# Patient Record
Sex: Male | Born: 1975 | Race: White | Hispanic: No | Marital: Single | Smoking: Current every day smoker
Health system: Southern US, Community
[De-identification: ages and names within clinical notes are randomized; demographics above are authoritative.]

---

## 2021-03-19 ENCOUNTER — Emergency Department (HOSPITAL_COMMUNITY)

## 2021-03-19 ENCOUNTER — Other Ambulatory Visit: Payer: Self-pay

## 2021-03-19 ENCOUNTER — Inpatient Hospital Stay (HOSPITAL_COMMUNITY): Admission: EM | Admit: 2021-03-19 | Discharge: 2021-03-21 | DRG: 552 | Attending: Surgery | Admitting: Surgery

## 2021-03-19 DIAGNOSIS — Z880 Allergy status to penicillin: Secondary | ICD-10-CM

## 2021-03-19 DIAGNOSIS — N179 Acute kidney failure, unspecified: Secondary | ICD-10-CM | POA: Diagnosis present

## 2021-03-19 DIAGNOSIS — Y92148 Other place in prison as the place of occurrence of the external cause: Secondary | ICD-10-CM | POA: Diagnosis not present

## 2021-03-19 DIAGNOSIS — G8220 Paraplegia, unspecified: Secondary | ICD-10-CM | POA: Diagnosis present

## 2021-03-19 DIAGNOSIS — Z20822 Contact with and (suspected) exposure to covid-19: Secondary | ICD-10-CM | POA: Diagnosis present

## 2021-03-19 DIAGNOSIS — Y9389 Activity, other specified: Secondary | ICD-10-CM | POA: Diagnosis not present

## 2021-03-19 DIAGNOSIS — X80XXXA Intentional self-harm by jumping from a high place, initial encounter: Secondary | ICD-10-CM | POA: Diagnosis present

## 2021-03-19 DIAGNOSIS — S32019A Unspecified fracture of first lumbar vertebra, initial encounter for closed fracture: Principal | ICD-10-CM | POA: Diagnosis present

## 2021-03-19 DIAGNOSIS — R4587 Impulsiveness: Secondary | ICD-10-CM | POA: Diagnosis present

## 2021-03-19 DIAGNOSIS — F17200 Nicotine dependence, unspecified, uncomplicated: Secondary | ICD-10-CM | POA: Diagnosis present

## 2021-03-19 DIAGNOSIS — Z9189 Other specified personal risk factors, not elsewhere classified: Secondary | ICD-10-CM

## 2021-03-19 DIAGNOSIS — F449 Dissociative and conversion disorder, unspecified: Secondary | ICD-10-CM | POA: Diagnosis present

## 2021-03-19 DIAGNOSIS — S32029A Unspecified fracture of second lumbar vertebra, initial encounter for closed fracture: Secondary | ICD-10-CM | POA: Diagnosis present

## 2021-03-19 DIAGNOSIS — T1490XA Injury, unspecified, initial encounter: Secondary | ICD-10-CM

## 2021-03-19 DIAGNOSIS — T1491XA Suicide attempt, initial encounter: Secondary | ICD-10-CM | POA: Diagnosis present

## 2021-03-19 DIAGNOSIS — Z1389 Encounter for screening for other disorder: Secondary | ICD-10-CM

## 2021-03-19 LAB — URINALYSIS, ROUTINE W REFLEX MICROSCOPIC
Bilirubin Urine: NEGATIVE
Glucose, UA: NEGATIVE mg/dL
Hgb urine dipstick: NEGATIVE
Ketones, ur: 20 mg/dL — AB
Leukocytes,Ua: NEGATIVE
Nitrite: NEGATIVE
Protein, ur: NEGATIVE mg/dL
Specific Gravity, Urine: >1.046 — ABNORMAL HIGH (ref 1.005–1.030)
pH: 7 (ref 5.0–8.0)

## 2021-03-19 LAB — I-STAT CHEM 8, ED
BUN: 13 mg/dL (ref 6–20)
Calcium, Ion: 1.15 mmol/L (ref 1.15–1.40)
Chloride: 108 mmol/L (ref 98–111)
Creatinine, Ser: 1.2 mg/dL (ref 0.61–1.24)
Glucose, Bld: 97 mg/dL (ref 70–99)
HCT: 44 % (ref 39.0–52.0)
Hemoglobin: 15 g/dL (ref 13.0–17.0)
Potassium: 3.9 mmol/L (ref 3.5–5.1)
Sodium: 144 mmol/L (ref 135–145)
TCO2: 24 mmol/L (ref 22–32)

## 2021-03-19 LAB — COMPREHENSIVE METABOLIC PANEL
ALT: 23 U/L (ref 0–44)
AST: 31 U/L (ref 15–41)
Albumin: 4 g/dL (ref 3.5–5.0)
Alkaline Phosphatase: 87 U/L (ref 38–126)
Anion gap: 10 (ref 5–15)
BUN: 13 mg/dL (ref 6–20)
CO2: 24 mmol/L (ref 22–32)
Calcium: 9.2 mg/dL (ref 8.9–10.3)
Chloride: 107 mmol/L (ref 98–111)
Creatinine, Ser: 1.33 mg/dL — ABNORMAL HIGH (ref 0.61–1.24)
GFR, Estimated: >60 mL/min (ref >60)
Glucose, Bld: 101 mg/dL — ABNORMAL HIGH (ref 70–99)
Potassium: 3.9 mmol/L (ref 3.5–5.1)
Sodium: 141 mmol/L (ref 135–145)
Total Bilirubin: 0.5 mg/dL (ref 0.3–1.2)
Total Protein: 7.6 g/dL (ref 6.5–8.1)

## 2021-03-19 LAB — LACTIC ACID, PLASMA: Lactic Acid, Venous: 1.5 mmol/L (ref 0.5–1.9)

## 2021-03-19 LAB — RAPID URINE DRUG SCREEN, HOSP PERFORMED
Amphetamines: NOT DETECTED
Barbiturates: NOT DETECTED
Benzodiazepines: POSITIVE — AB
Cocaine: NOT DETECTED
Opiates: POSITIVE — AB
Tetrahydrocannabinol: NOT DETECTED

## 2021-03-19 LAB — CBC
HCT: 46.1 % (ref 39.0–52.0)
Hemoglobin: 15.1 g/dL (ref 13.0–17.0)
MCH: 26.5 pg (ref 26.0–34.0)
MCHC: 32.8 g/dL (ref 30.0–36.0)
MCV: 81 fL (ref 80.0–100.0)
Platelets: 273 10*3/uL (ref 150–400)
RBC: 5.69 MIL/uL (ref 4.22–5.81)
RDW: 16 % — ABNORMAL HIGH (ref 11.5–15.5)
WBC: 9.9 10*3/uL (ref 4.0–10.5)
nRBC: 0 % (ref 0.0–0.2)

## 2021-03-19 LAB — PROTIME-INR
INR: 1.2 (ref 0.8–1.2)
Prothrombin Time: 14.8 seconds (ref 11.4–15.2)

## 2021-03-19 LAB — RESP PANEL BY RT-PCR (FLU A&B, COVID) ARPGX2
Influenza A by PCR: NEGATIVE
Influenza B by PCR: NEGATIVE
SARS Coronavirus 2 by RT PCR: NEGATIVE

## 2021-03-19 LAB — ETHANOL: Alcohol, Ethyl (B): <10 mg/dL (ref <10)

## 2021-03-19 IMAGING — CT CT CERVICAL SPINE W/O CM
3 of 5 series · 13 of 35 positions shown, 14 images · non-contrast
Comparison: None.

CLINICAL DATA: Head and neck trauma, fall



[Series 4: c spine soft · axial · 0.37mm/px · z∈[-250,-116]mm · 4 of 101 slices shown, 5 images]
[im 17/101  soft-tissue]
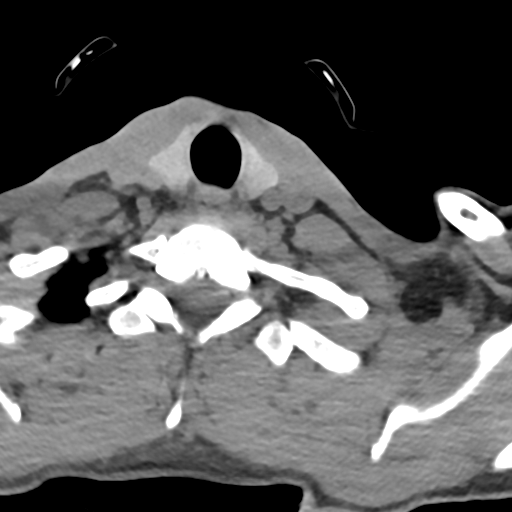
[im 17/101  bone]
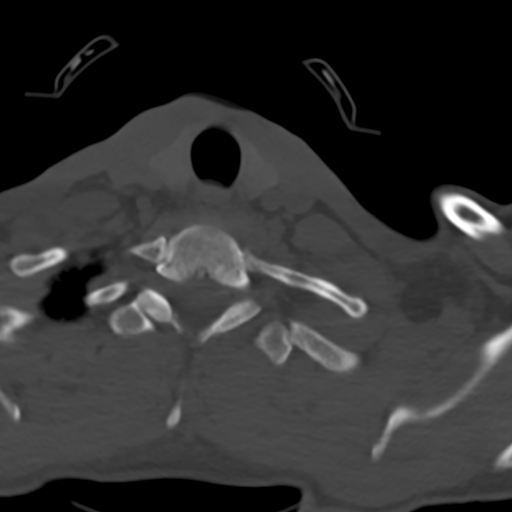
[im 34/101  bone]
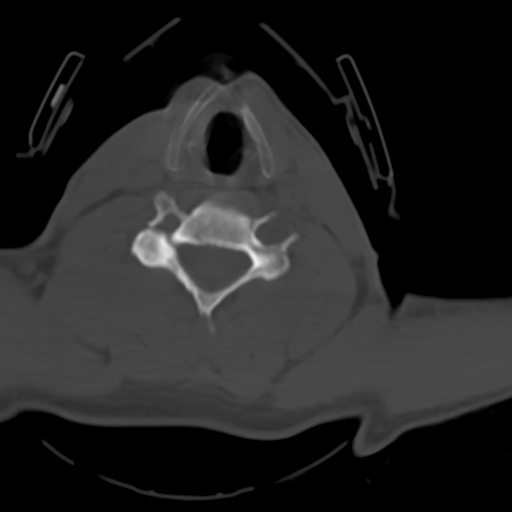
[im 67/101  bone]
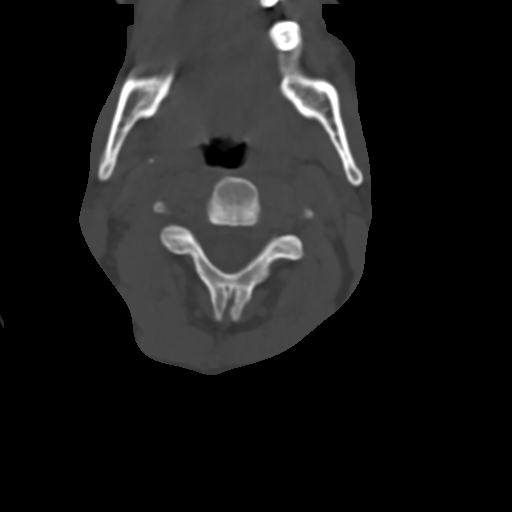
[im 84/101  bone]
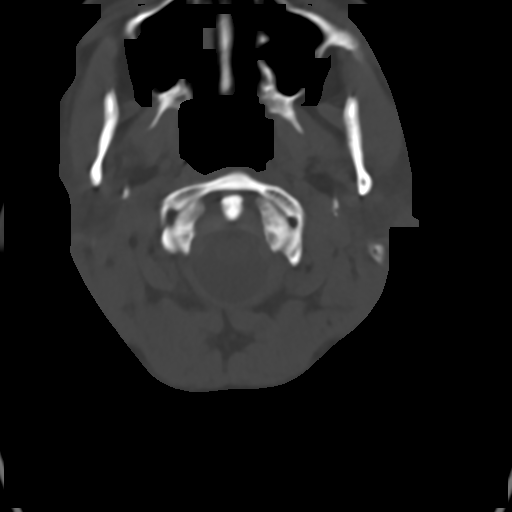

[Series 6: sag bone · sagittal · 0.29mm/px · 6 of 60 slices shown]
[im 10/60  bone]
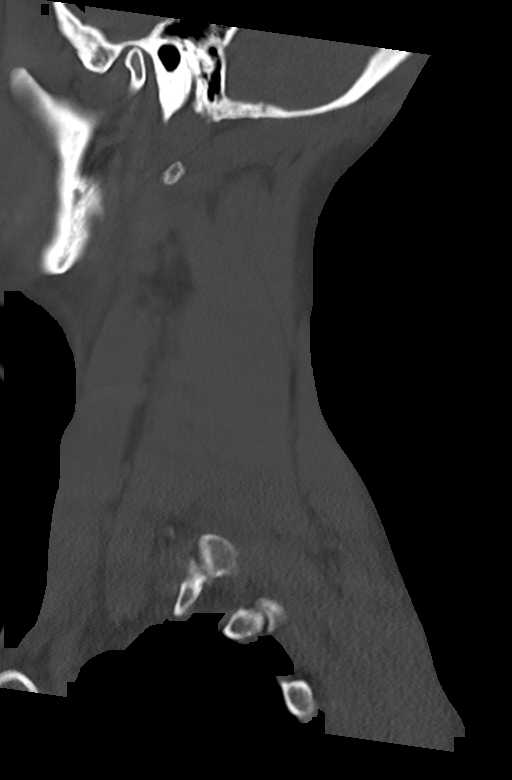
[im 20/60  bone]
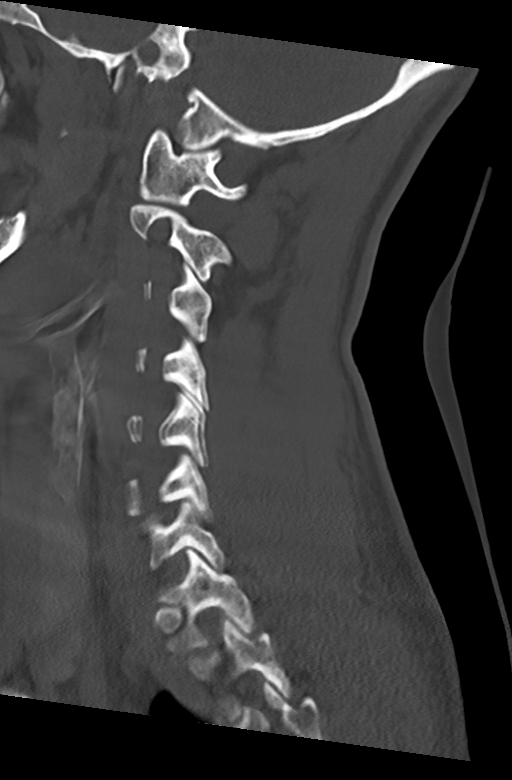
[im 28/60  soft-tissue]
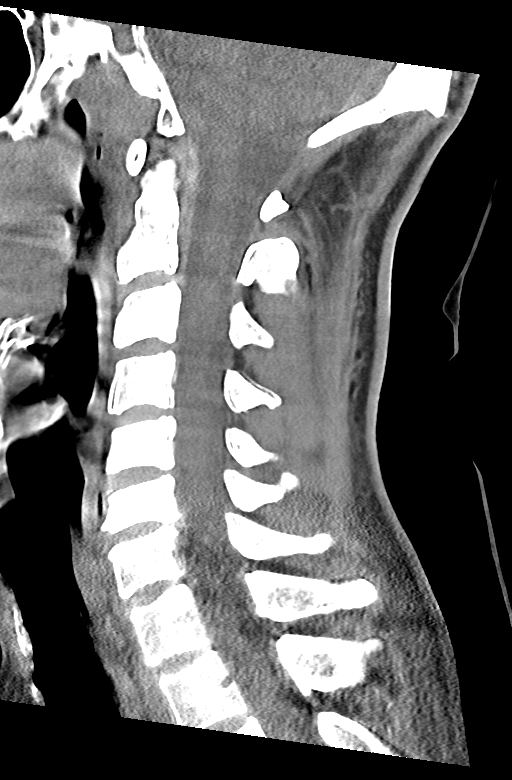
[im 30/60  bone]
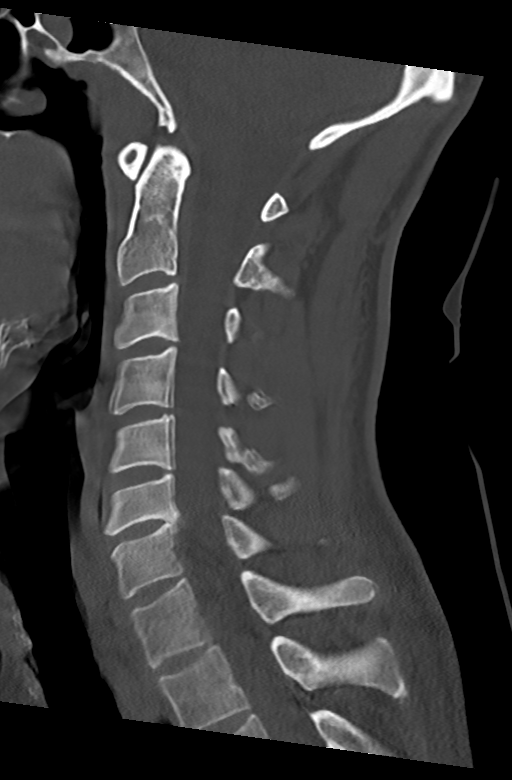
[im 40/60  bone]
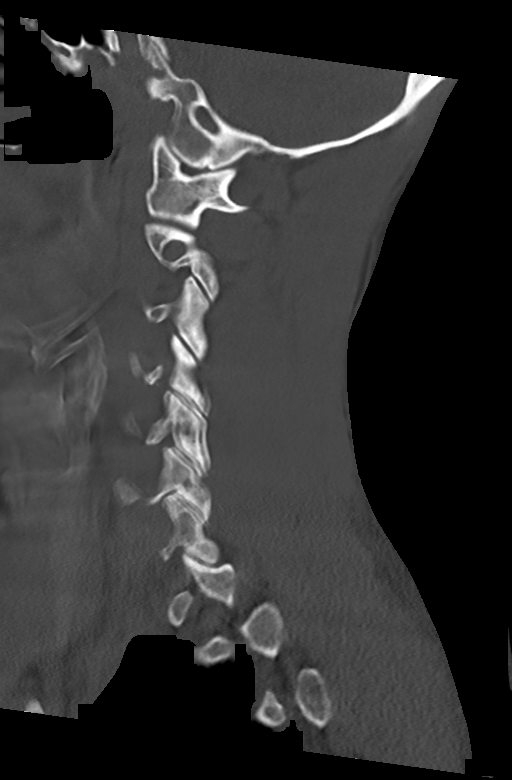
[im 50/60  bone]
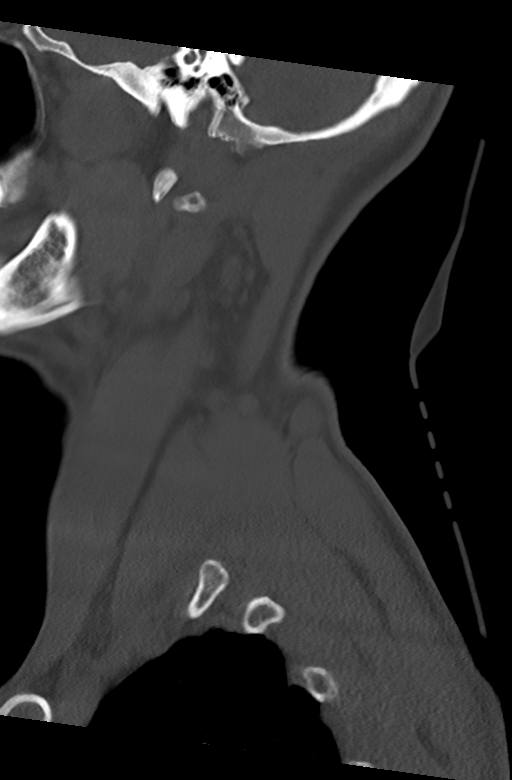

[Series 9: cor bone · coronal · 0.28mm/px · 3 of 42 slices shown]
[im 9/42  bone]
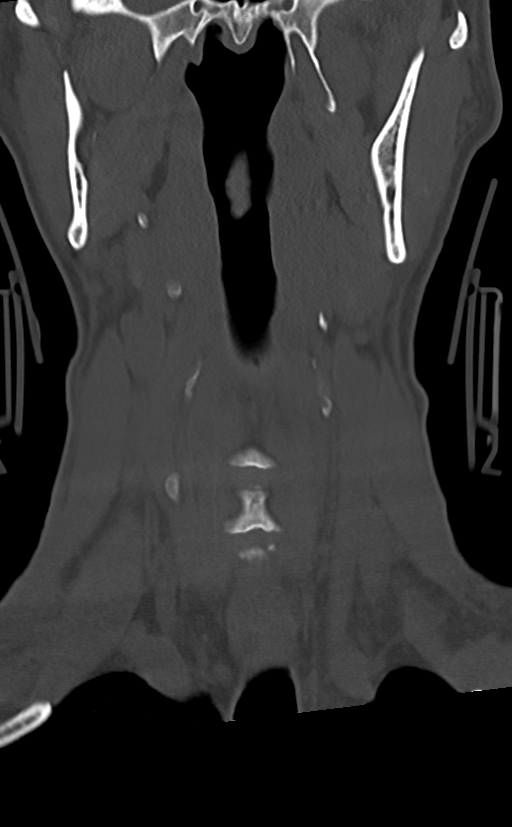
[im 17/42  bone]
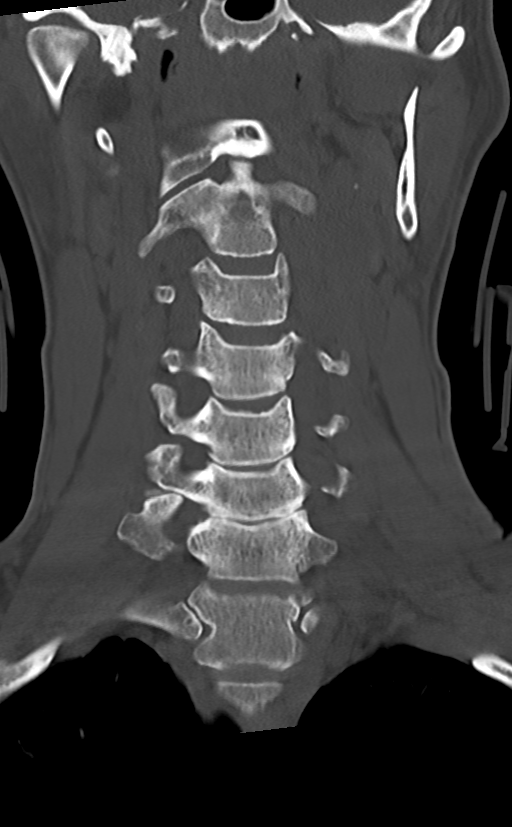
[im 25/42  bone]
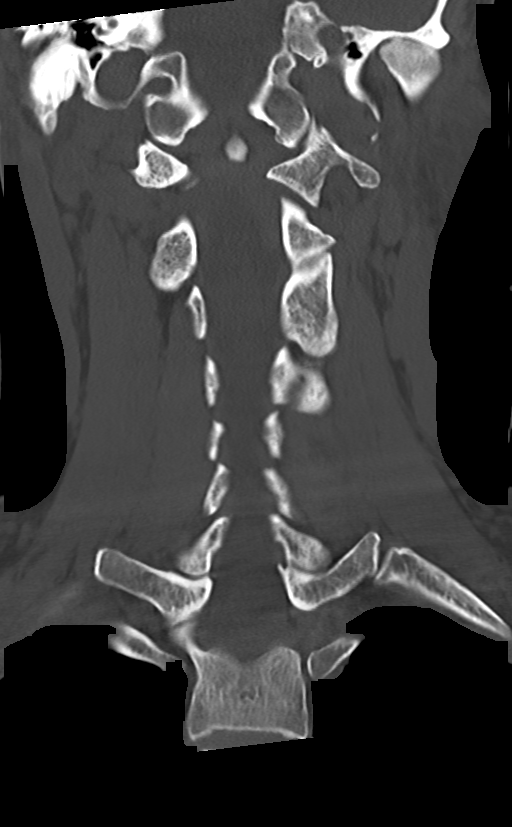

[13 of 35 positions shown; findings below may reference images not displayed]

FINDINGS: CT HEAD FINDINGS

Brain: No evidence of acute infarction, hemorrhage, hydrocephalus,
extra-axial collection or mass lesion/mass effect.

Vascular: No hyperdense vessel or unexpected calcification.

Skull: Normal. Negative for fracture or focal lesion.

Sinuses/Orbits: No acute finding.

Other: None.

CT CERVICAL SPINE FINDINGS

Examination of the cervical spine is mildly limited by motion
artifact throughout.

Alignment: Normal.

Skull base and vertebrae: No acute fracture. No primary bone lesion
or focal pathologic process.

Soft tissues and spinal canal: No prevertebral fluid or swelling. No
visible canal hematoma.

Disc levels: Focally mild disc space height loss and osteophytosis
of C6-C7. Disc spaces are otherwise preserved.

Upper chest: Negative.

Other: None.
IMPRESSION: 1. No acute intracranial pathology.
2. Examination of the cervical spine is mildly limited by motion
artifact throughout. Within this limitation, no fracture or static
subluxation of the cervical spine.

These results were called by telephone at the time of interpretation
on [DATE] at [DATE] to Dr. MIKAELA NICOLE, who verbally
acknowledged these results.

## 2021-03-19 IMAGING — DX DG PORTABLE PELVIS
1 series · 1 of 1 positions shown · non-contrast
Comparison: None.

CLINICAL DATA: Trauma.

EXAM:
PORTABLE PELVIS 1-2 VIEWS

[pelvis]
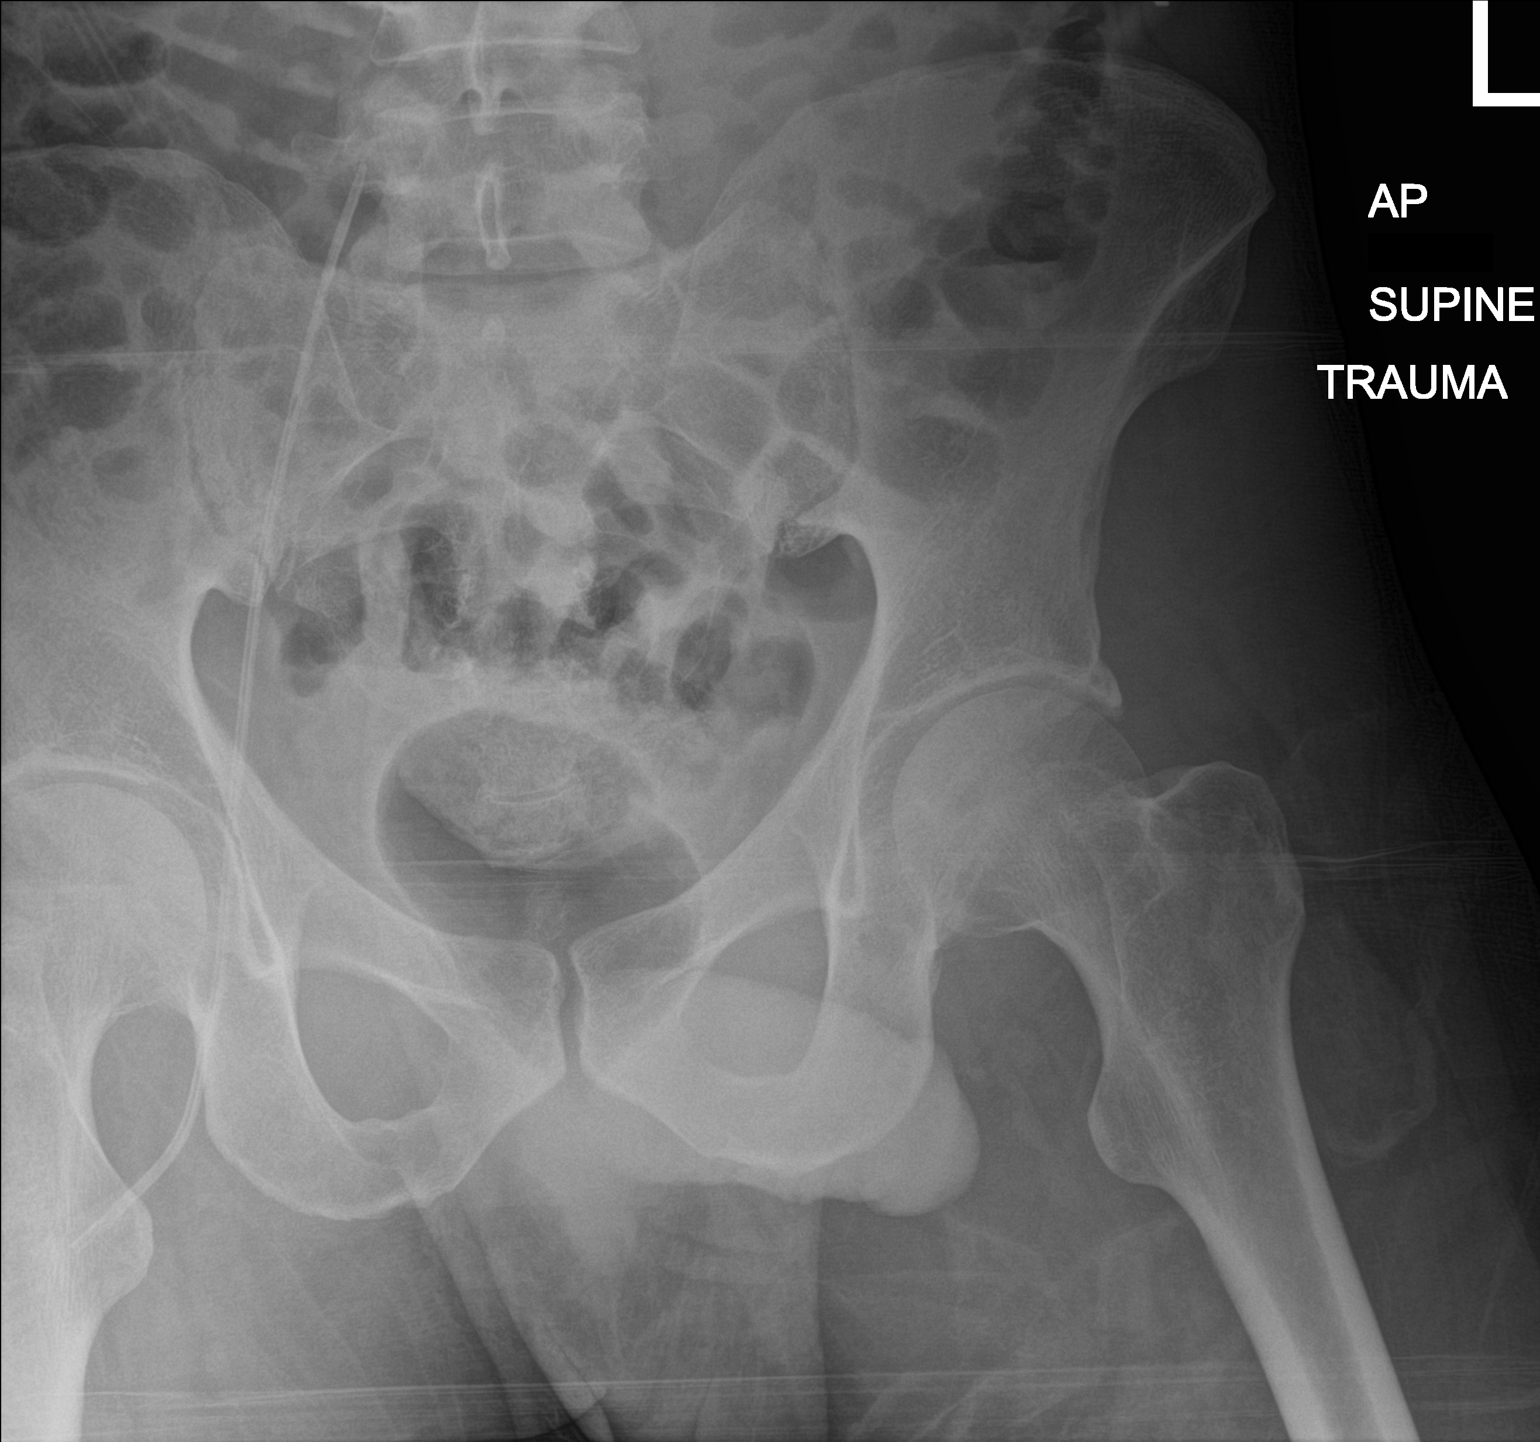

[1 of 1 positions shown; findings below may reference images not displayed]

FINDINGS: Portion of the RIGHT pelvis is off the field of view but okayed by
the trauma surgeon.

No acute bony abnormalities are noted.

A RIGHT femoral catheter is noted.

Bowel gas pattern is unremarkable.
IMPRESSION: No acute bony abnormalities.

## 2021-03-19 IMAGING — CT CT CHEST-ABD-PELV W/ CM
2 of 5 series · 12 of 46 positions shown, 14 images · IV contrast (agent unspecified)
Comparison: None.

CLINICAL DATA: Polytrauma, fall

EXAM:
CT CHEST, ABDOMEN, AND PELVIS WITH CONTRAST
CT THORACIC AND LUMBAR SPINE WITH CONTRAST
TECHNIQUE: Multidetector CT imaging of the chest, abdomen and pelvis was
performed following the standard protocol during bolus
administration of intravenous contrast.

[Series 1: cap with · axial · 0.72mm/px · z∈[-876,-276]mm · 9 of 145 slices shown, 11 images]
[im 13/145  soft-tissue]
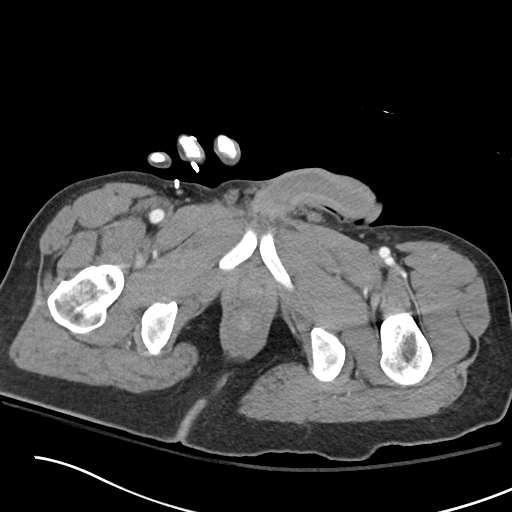
[im 13/145  bone]
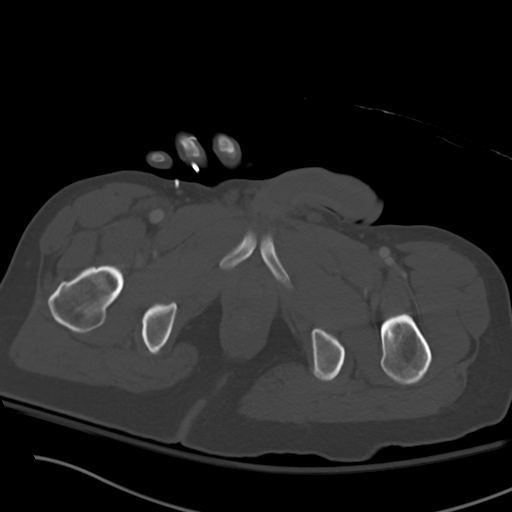
[im 25/145  soft-tissue]
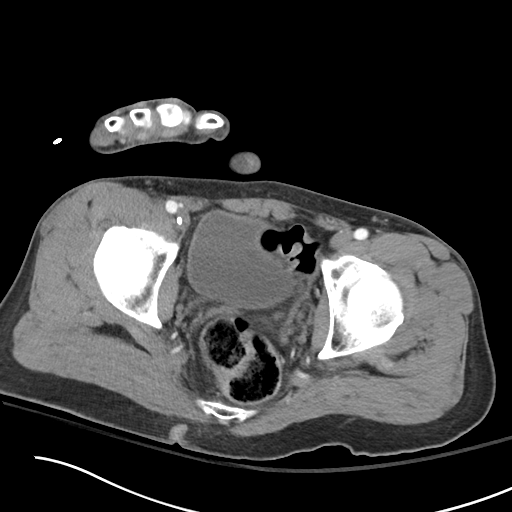
[im 49/145  soft-tissue]
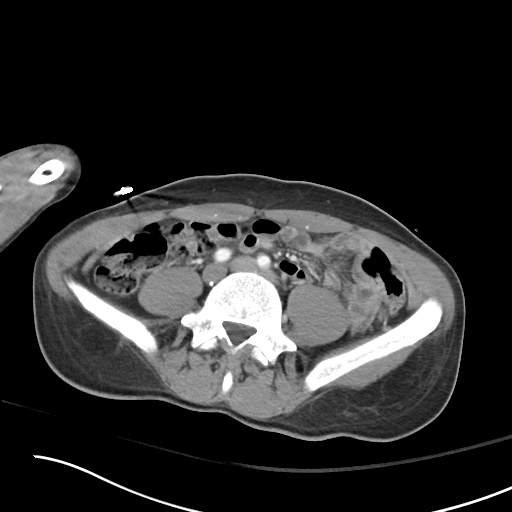
[im 61/145  soft-tissue]
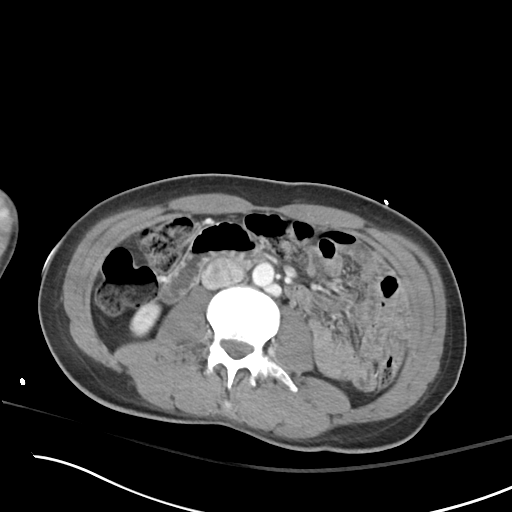
[im 73/145  soft-tissue]
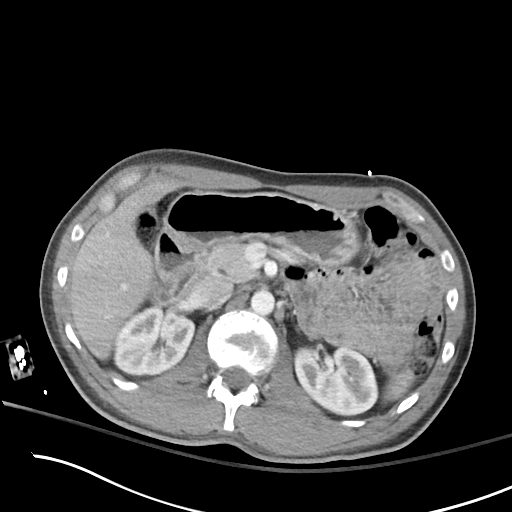
[im 85/145  soft-tissue]
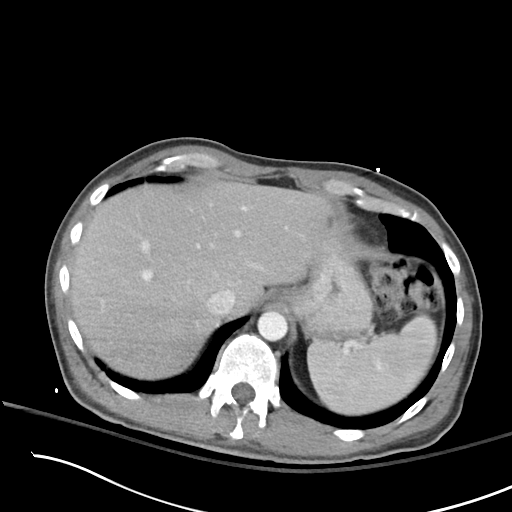
[im 97/145  soft-tissue]
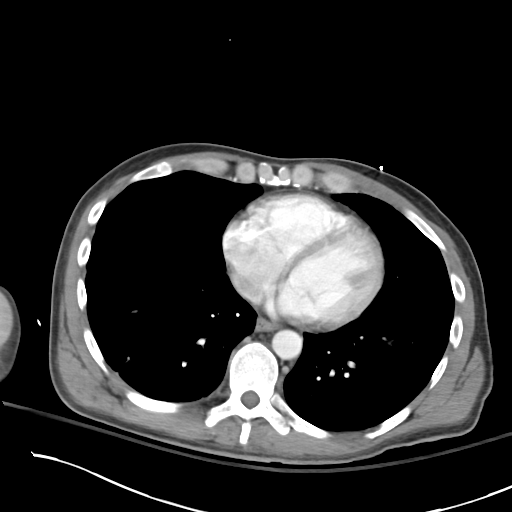
[im 121/145  soft-tissue]
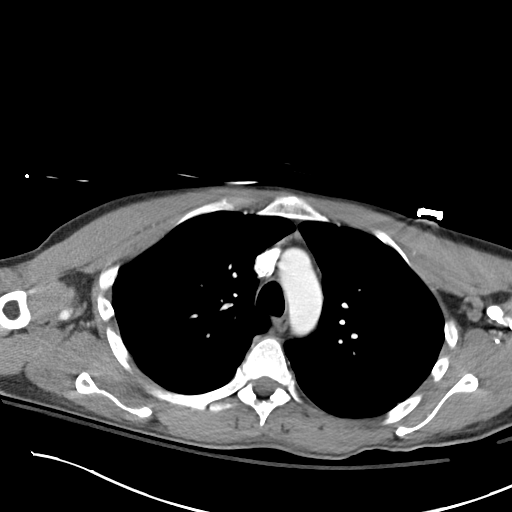
[im 133/145  soft-tissue]
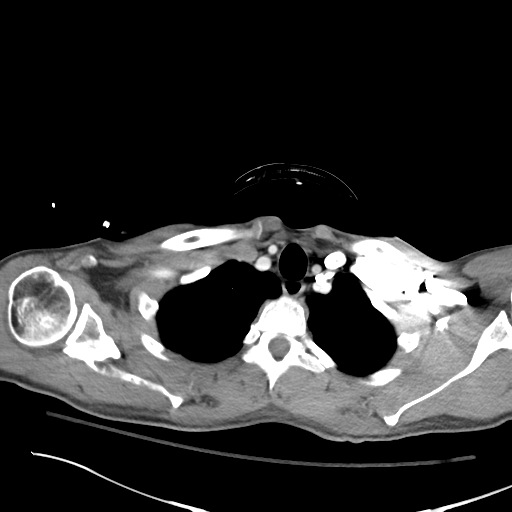
[im 133/145  bone]
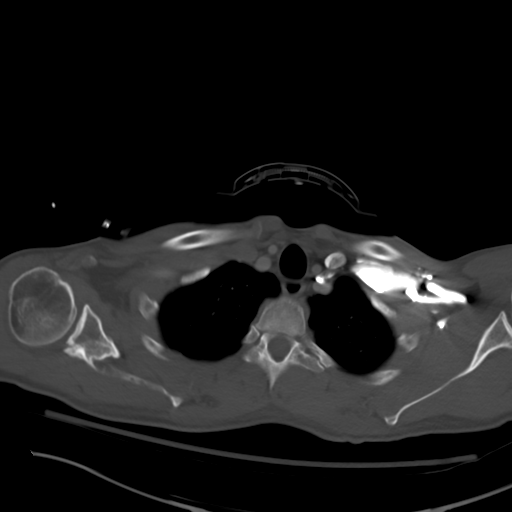

[Series 4: cor · coronal · 0.71mm/px · 3 of 78 slices shown]
[im 26/78  soft-tissue]
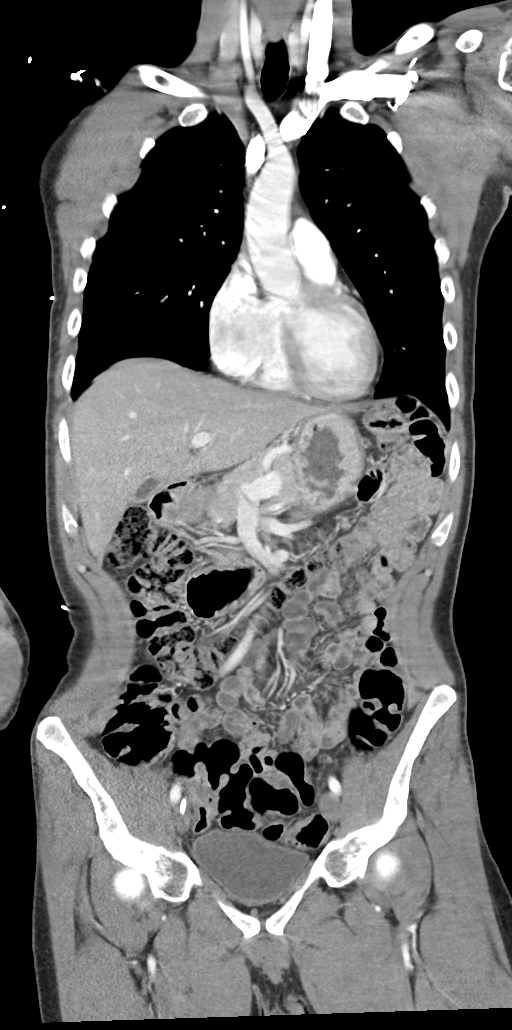
[im 35/78  soft-tissue]
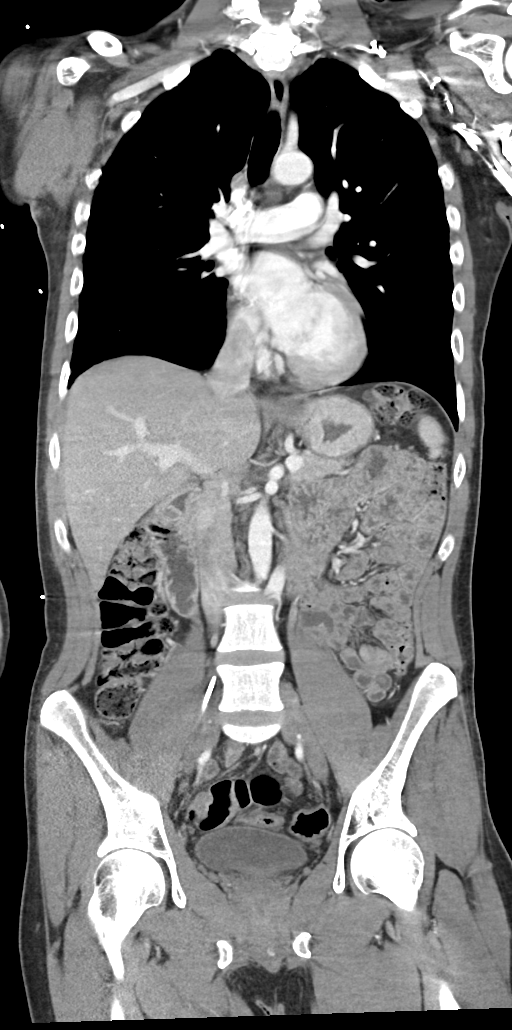
[im 43/78  soft-tissue]
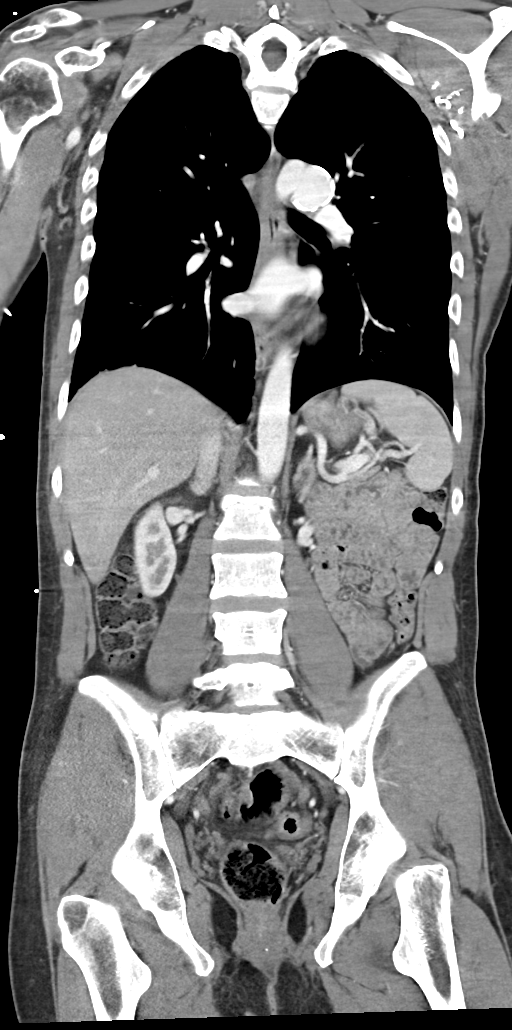

[12 of 46 positions shown; findings below may reference images not displayed]

Multidetector CT imaging of the thoracic and lumbar spine was
performed following the standard protocol during bolus
administration of intravenous contrast. Dedicated multiplanar
reformats were generated and reviewed.

RADIATION DOSE REDUCTION: This exam was performed according to the
departmental dose-optimization program which includes automated
exposure control, adjustment of the mA and/or kV according to
patient size and/or use of iterative reconstruction technique.

CONTRAST:  100mL OMNIPAQUE IOHEXOL 350 MG/ML SOLN
FINDINGS: CT CHEST FINDINGS

Cardiovascular: Incidental calcified aneurysm or chronic focal
dissection at the left aspect of the distal aortic arch, maximum
caliber of the vessel at this site 4.0 x 3.3 cm (series 4, image 46,
series 1, image 28). Normal heart size. No pericardial effusion.

Mediastinum/Nodes: No enlarged mediastinal, hilar, or axillary lymph
nodes. Thyroid gland, trachea, and esophagus demonstrate no
significant findings.

Lungs/Pleura: Mild paraseptal emphysema. No pleural effusion or
pneumothorax.

Musculoskeletal: No chest wall mass or suspicious osseous lesions
identified.

CT ABDOMEN PELVIS FINDINGS

Hepatobiliary: No solid liver abnormality is seen. No gallstones,
gallbladder wall thickening, or biliary dilatation.

Pancreas: Unremarkable. No pancreatic ductal dilatation or
surrounding inflammatory changes.

Spleen: Normal in size without significant abnormality.

Adrenals/Urinary Tract: Adrenal glands are unremarkable. Kidneys are
normal, without renal calculi, solid lesion, or hydronephrosis.
Bladder is unremarkable.

Stomach/Bowel: Stomach is within normal limits. Appendix appears
normal. No evidence of bowel wall thickening, distention, or
inflammatory changes.

Vascular/Lymphatic: Right femoral venous catheter. No enlarged
abdominal or pelvic lymph nodes.

Reproductive: No mass or other abnormality.

Other: Status post umbilical hernia repair.  No ascites.

Musculoskeletal: No acute osseous findings.

CT THORACIC AND LUMBAR SPINE FINDINGS

Alignment: Normal.

Vertebral bodies: Acute superior endplate wedge fractures of L1 and
L2 with less than 25% anterior height loss (series 5, image 56).

Disc spaces: Intact.

Soft tissues: Unremarkable.  No evident hematoma.
IMPRESSION: 1. Acute superior endplate wedge fractures of L1 and L2 with less
than 25% anterior height loss. No other fracture or dislocation
identified.
2. No evidence of acute traumatic injury to the organs of the chest,
abdomen, or pelvis.
3. Incidental note of a focal, saccular aneurysm or chronic focal
dissection at the left aspect of the distal aortic arch, maximum
caliber of the vessel at this site 4.0 x 3.3 cm. Although this does
not reflect acute traumatic vascular injury, recommend referral to
vascular specialist on a nonemergent, outpatient basis. This
recommendation follows [1N]
ACCF/AHA/AATS/ACR/ASA/SCA/SUFO/SUFO/SUFO/SUFO Guidelines for the
Diagnosis and Management of Patients With Thoracic Aortic Disease.
Circulation. [1N]; 121: [id number]. Aortic aneurysm NOS ([1N]-[1N])
4. Mild emphysema.

Acute findings were called by telephone at the time of
interpretation on [DATE] at [DATE] to Dr. SUFO, Who
verbally acknowledged these results.

Emphysema ([1N]-[1N]).

## 2021-03-19 IMAGING — CT CT HEAD W/O CM
4 series · 16 of 47 positions shown, 18 images · non-contrast
Comparison: None.

CLINICAL DATA: Head and neck trauma, fall



[Series 3: head wo · axial · 0.43mm/px · z∈[-118,+22]mm · 7 of 38 slices shown, 9 images]
[im 5/38  brain]
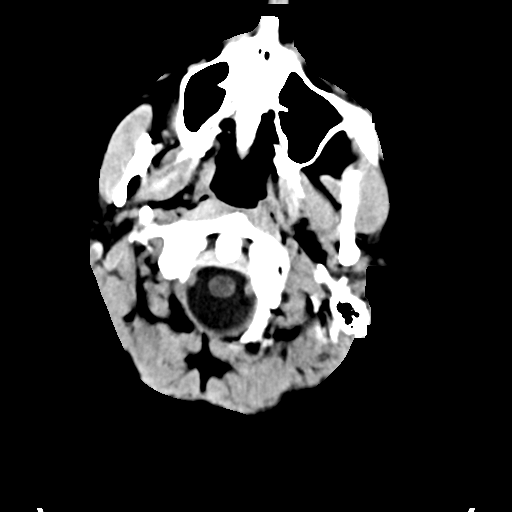
[im 5/38  bone]
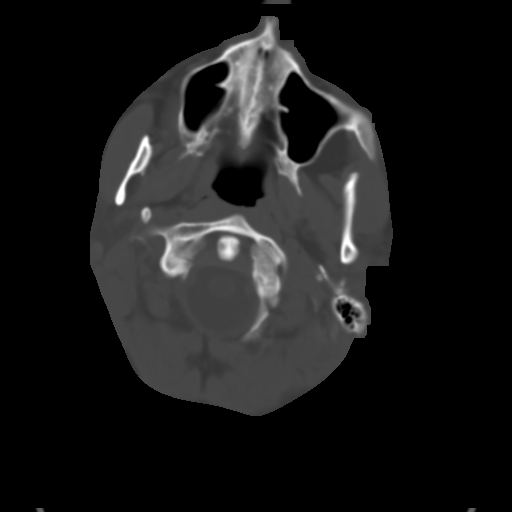
[im 10/38  brain]
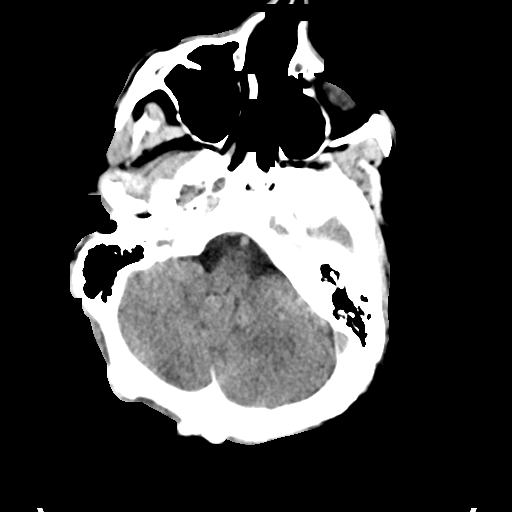
[im 14/38  brain]
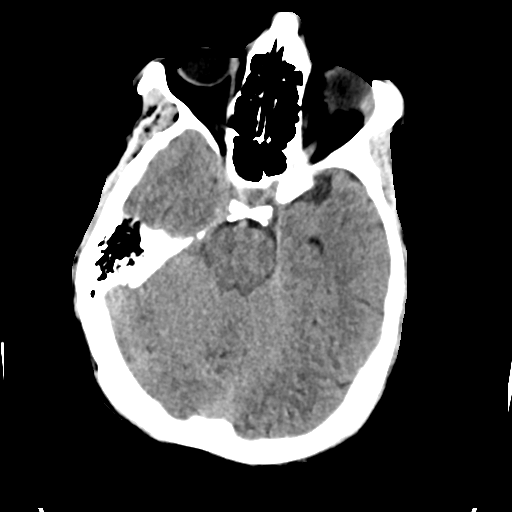
[im 19/38  brain]
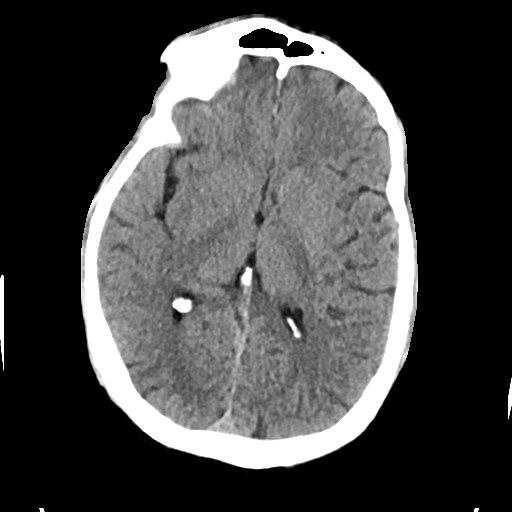
[im 24/38  brain]
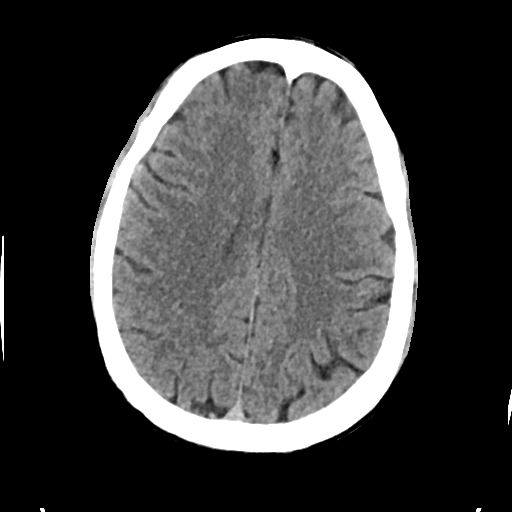
[im 24/38  bone]
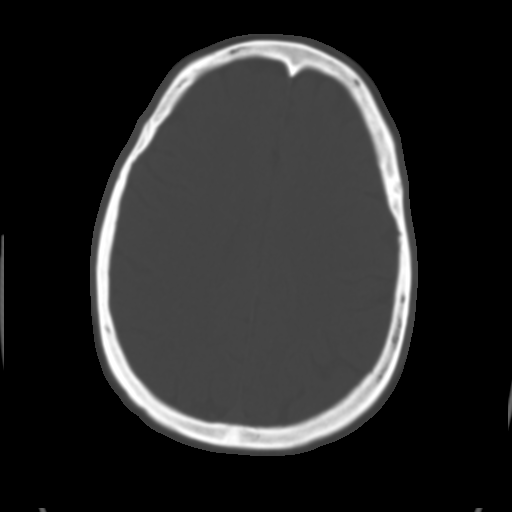
[im 28/38  brain]
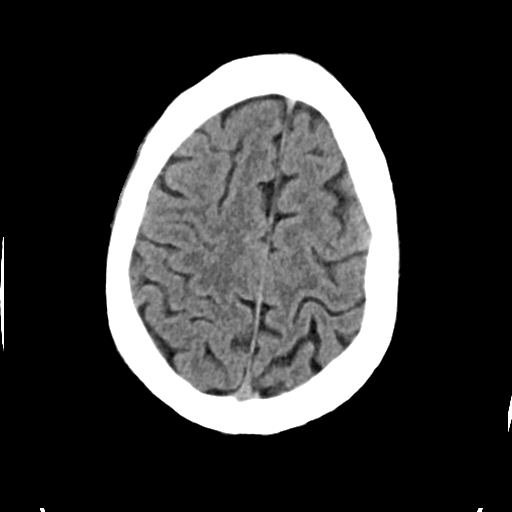
[im 33/38  brain]
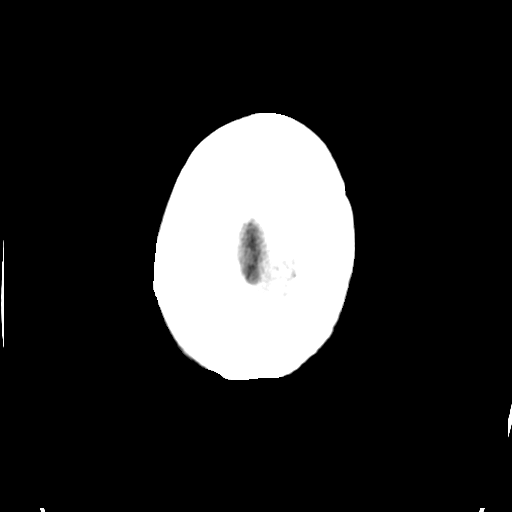

[Series 4: head bone · axial · 0.43mm/px · z∈[-120,-84]mm · 3 of 94 slices shown]
[im 10/94  bone]
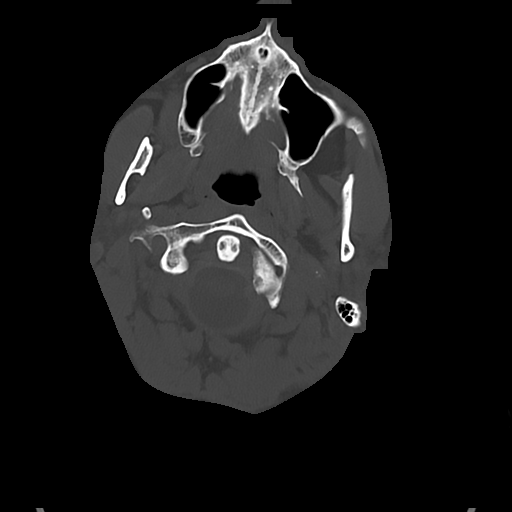
[im 19/94  bone]
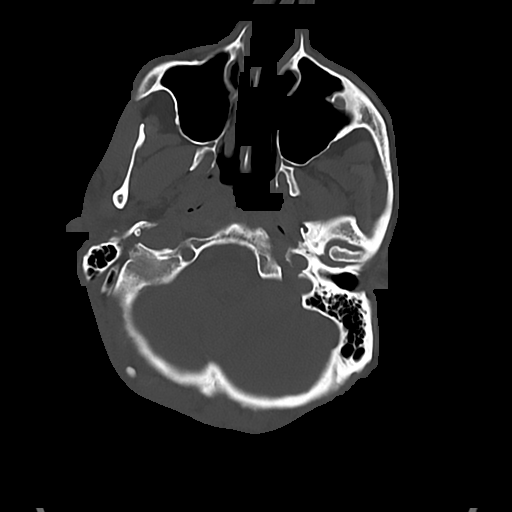
[im 28/94  bone]
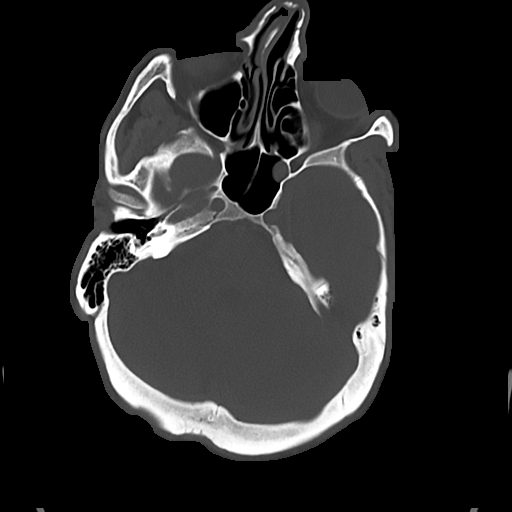

[Series 5: cor soft · coronal · 0.35mm/px · 3 of 73 slices shown]
[im 28/73  brain]
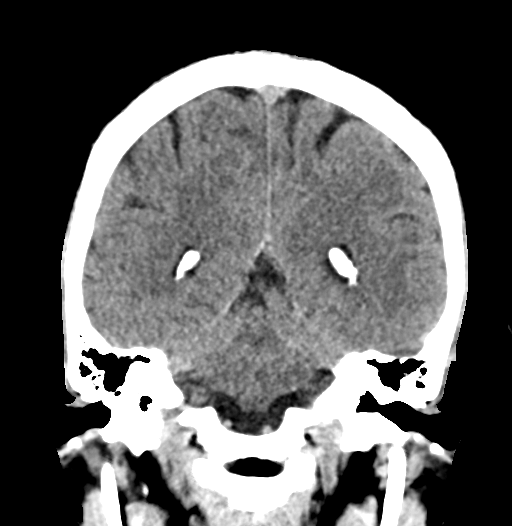
[im 34/73  brain]
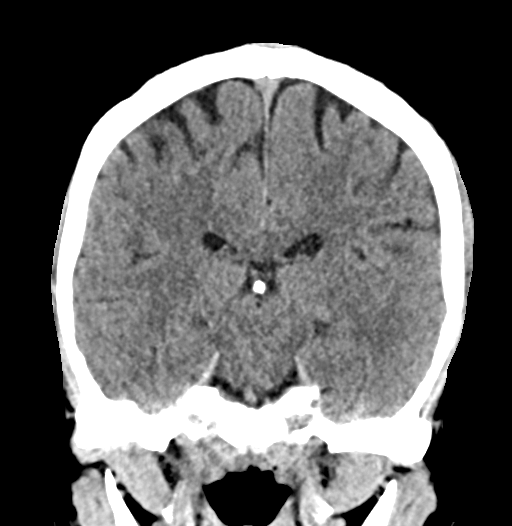
[im 40/73  brain]
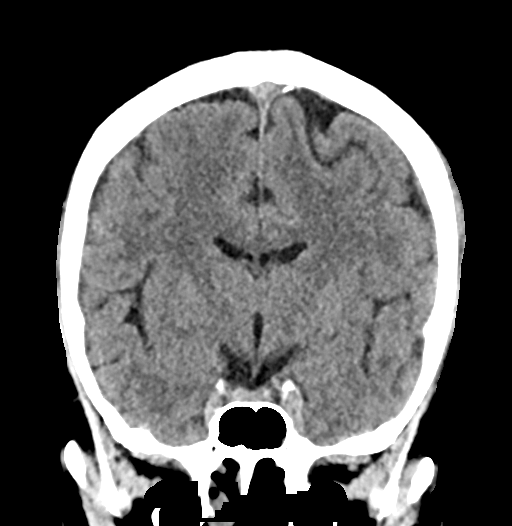

[Series 6: sag soft · sagittal · 0.36mm/px · 3 of 60 slices shown]
[im 22/60  brain]
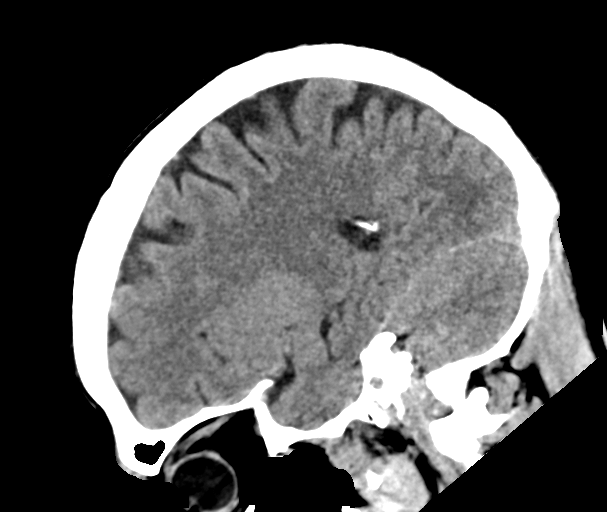
[im 30/60  brain]
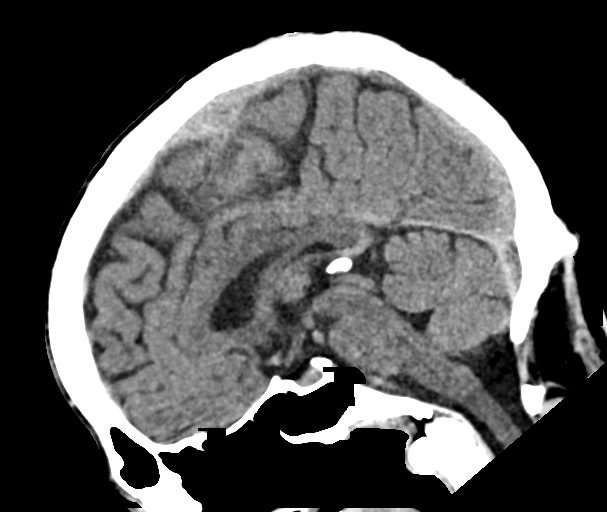
[im 39/60  brain]
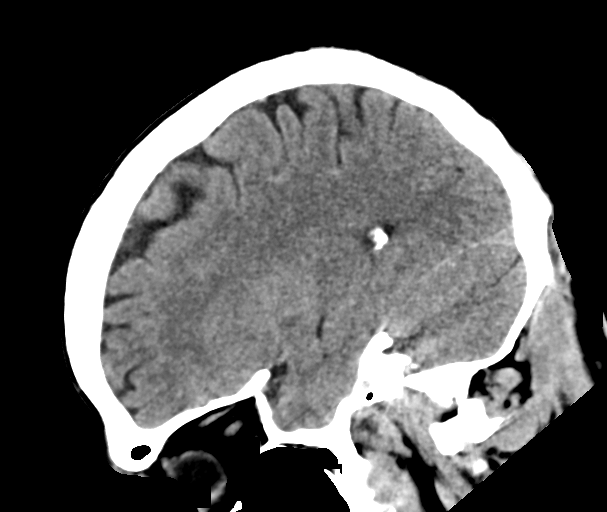

[16 of 47 positions shown; findings below may reference images not displayed]

FINDINGS: CT HEAD FINDINGS

Brain: No evidence of acute infarction, hemorrhage, hydrocephalus,
extra-axial collection or mass lesion/mass effect.

Vascular: No hyperdense vessel or unexpected calcification.

Skull: Normal. Negative for fracture or focal lesion.

Sinuses/Orbits: No acute finding.

Other: None.

CT CERVICAL SPINE FINDINGS

Examination of the cervical spine is mildly limited by motion
artifact throughout.

Alignment: Normal.

Skull base and vertebrae: No acute fracture. No primary bone lesion
or focal pathologic process.

Soft tissues and spinal canal: No prevertebral fluid or swelling. No
visible canal hematoma.

Disc levels: Focally mild disc space height loss and osteophytosis
of C6-C7. Disc spaces are otherwise preserved.

Upper chest: Negative.

Other: None.
IMPRESSION: 1. No acute intracranial pathology.
2. Examination of the cervical spine is mildly limited by motion
artifact throughout. Within this limitation, no fracture or static
subluxation of the cervical spine.

These results were called by telephone at the time of interpretation
on [DATE] at [DATE] to Dr. MIKAELA NICOLE, who verbally
acknowledged these results.

## 2021-03-19 IMAGING — DX DG KNEE 1-2V PORT*R*
1 series · 4 of 4 positions shown · non-contrast
Comparison: None.

CLINICAL DATA: 45-year-old male with possible metal in RIGHT knee.
Screen for MRI.

EXAM:
PORTABLE RIGHT KNEE - 1-2 VIEW

[Series 1: knee · 0.14mm/px · 4 of 4 slices shown]
[im 1/4]
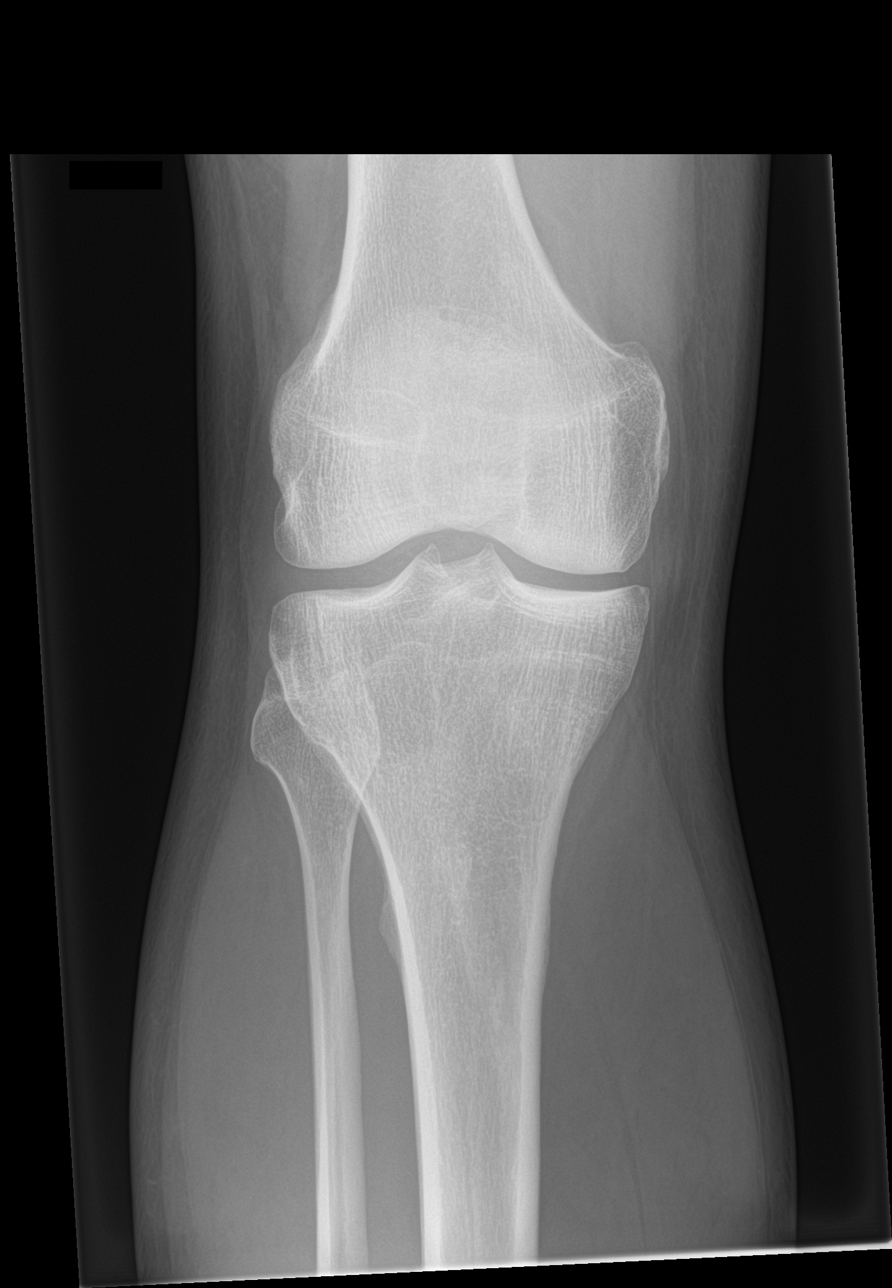
[im 2/4]
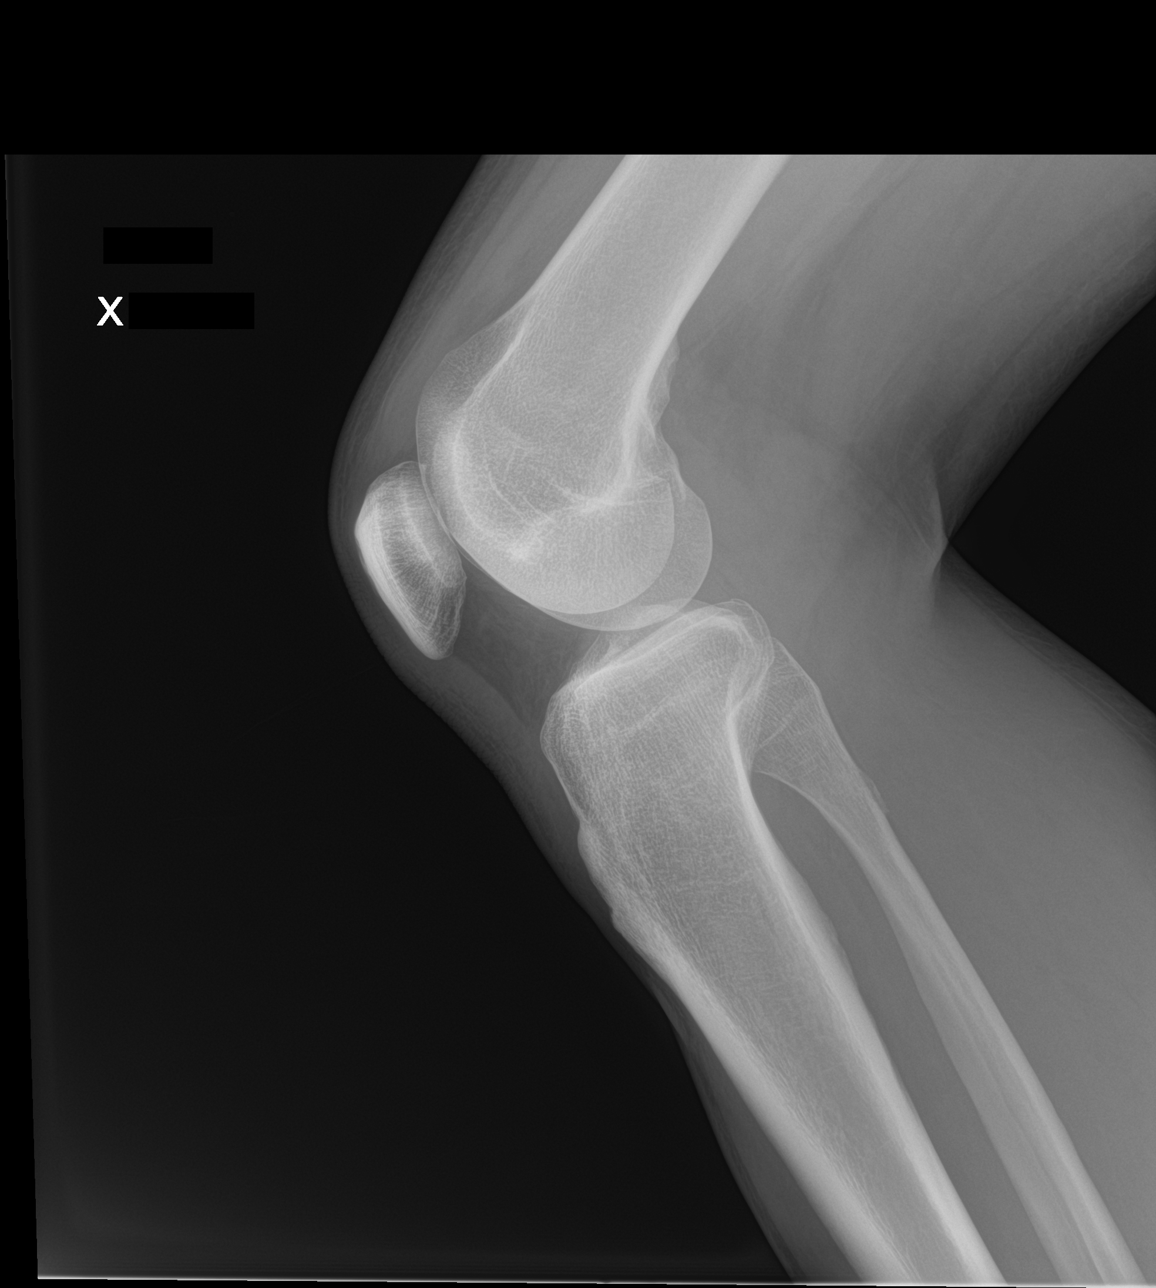
[im 3/4]
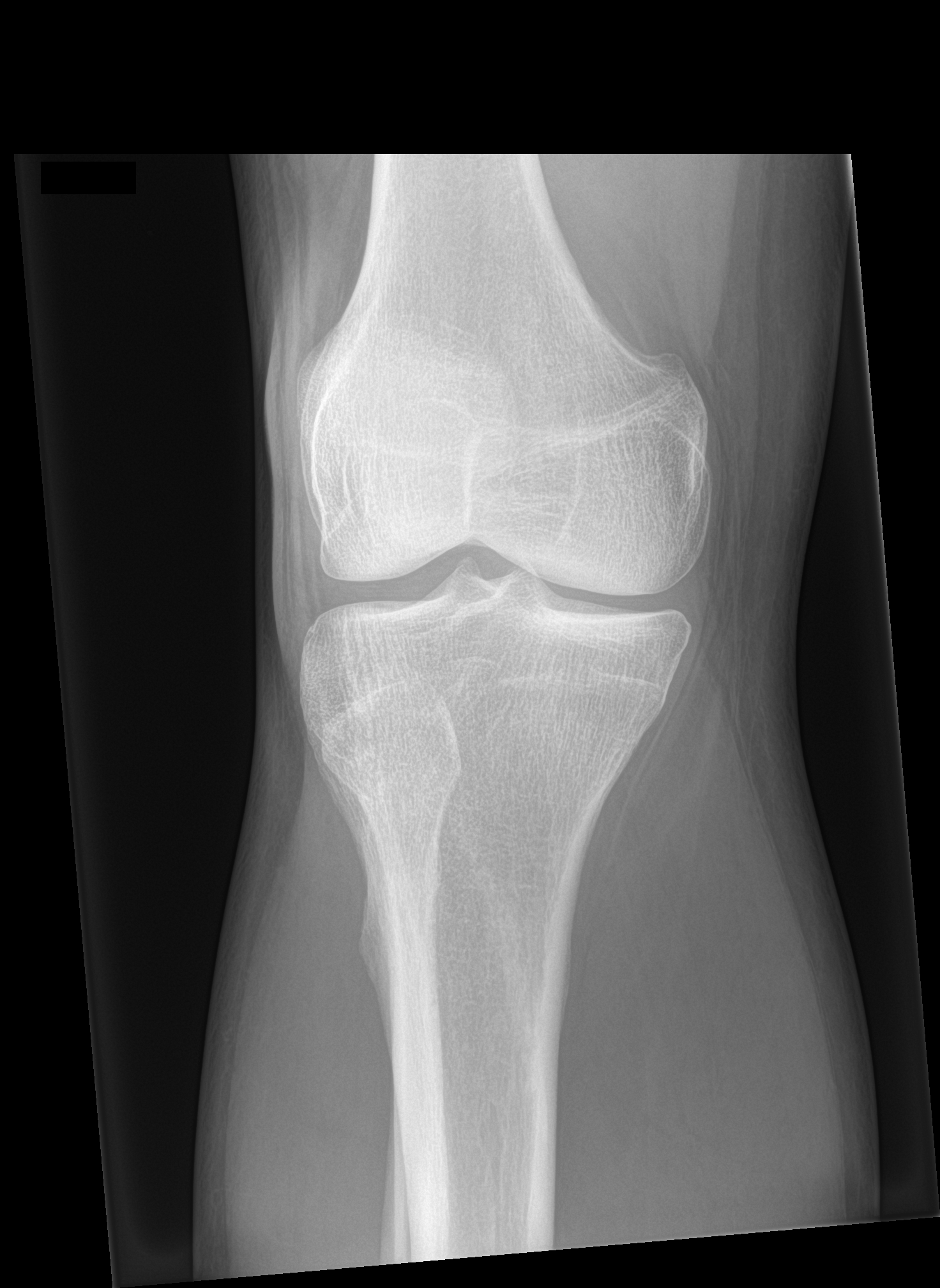
[im 4/4]
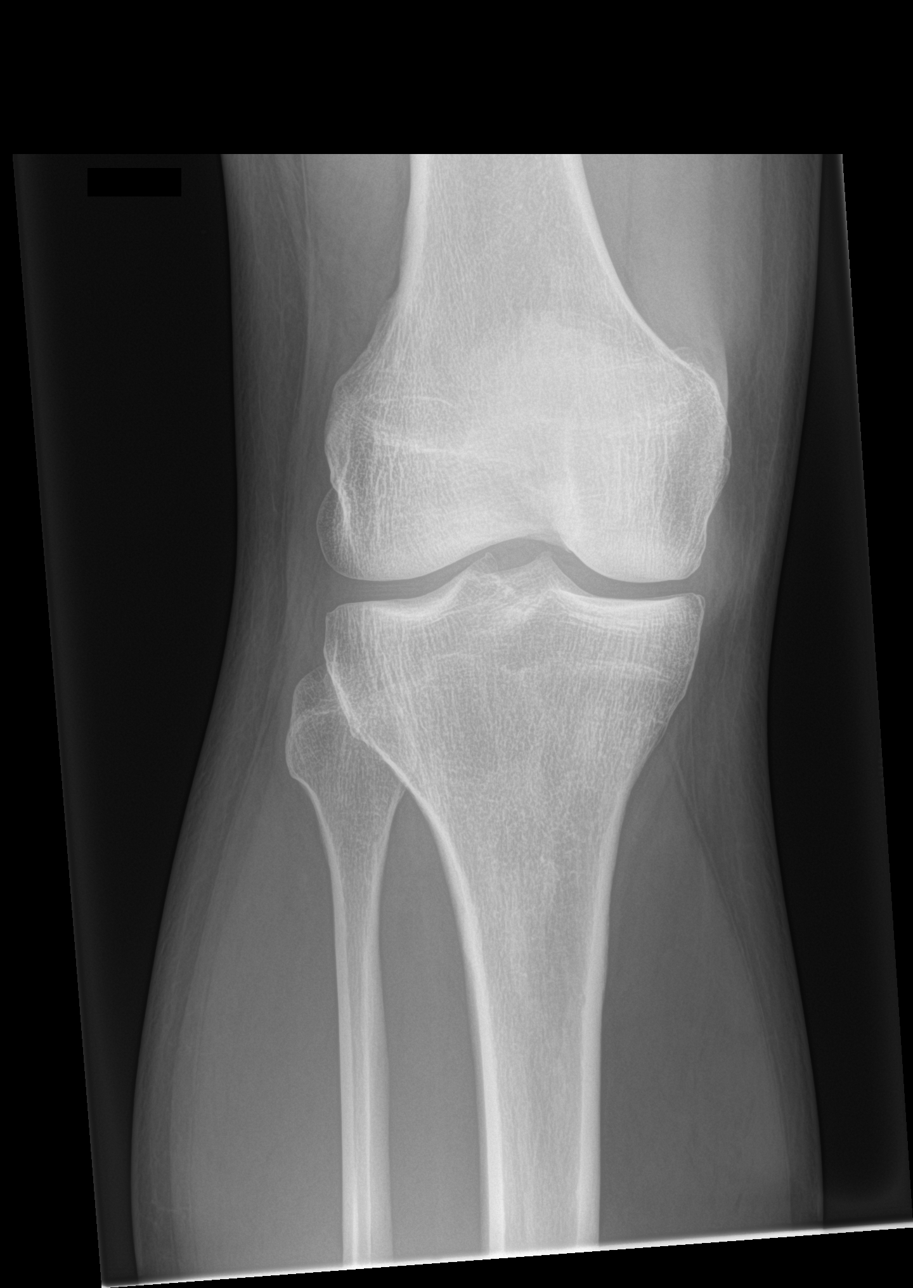

[4 of 4 positions shown; findings below may reference images not displayed]

FINDINGS: There is no evidence of metallic foreign body in the RIGHT knee.

The bony structures and joint spaces are unremarkable.

No fracture, subluxation, dislocation or focal bony lesions
identified.

There is no evidence of joint effusion.
IMPRESSION: Negative.  No evidence of metallic foreign body.

## 2021-03-19 IMAGING — MR MR LUMBAR SPINE W/O CM
4 of 5 series · 25 of 48 positions shown · non-contrast
Comparison: Prior CT from earlier the same day.

CLINICAL DATA: Initial evaluation for acute trauma, fall.

EXAM:
MRI CERVICAL, THORACIC AND LUMBAR SPINE WITHOUT CONTRAST
TECHNIQUE: Multiplanar and multiecho pulse sequences of the cervical spine, to
include the craniocervical junction and cervicothoracic junction,
and thoracic and lumbar spine, were obtained without intravenous
contrast.

[Series 35: T2 · sagittal · 4.0mm · 0.78mm/px · 6 of 17 slices shown (1 of 2)]
[im 1/17]
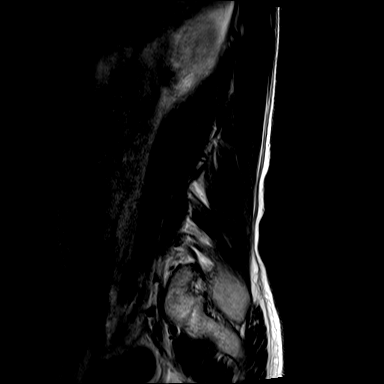
[im 4/17]
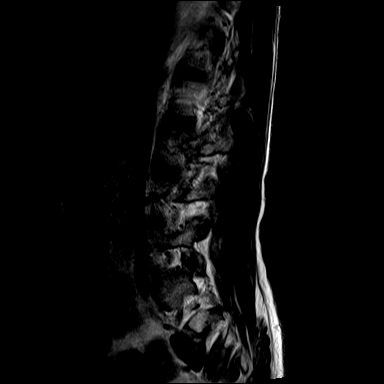
[im 7/17]
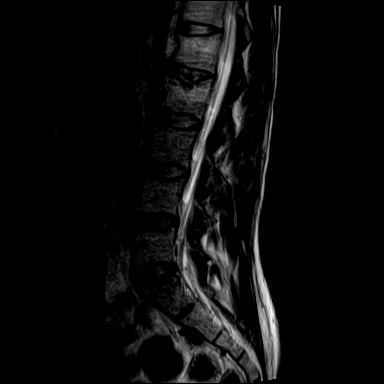
[im 10/17]
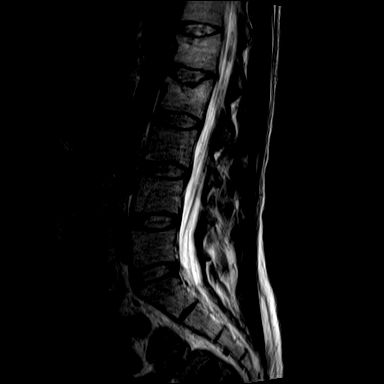
[im 13/17]
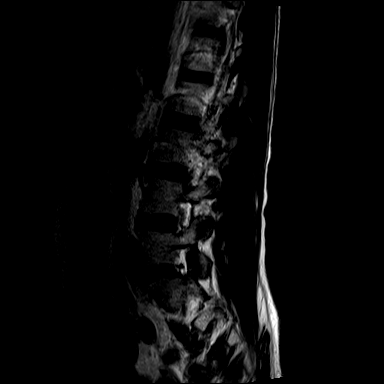
[im 17/17]
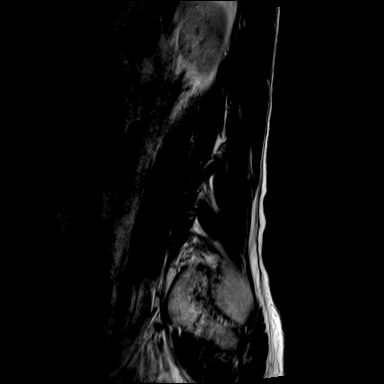

[Series 37: T1 · sagittal · 4.0mm · 0.88mm/px · 7 of 17 slices shown (1 of 2)]
[im 1/17]
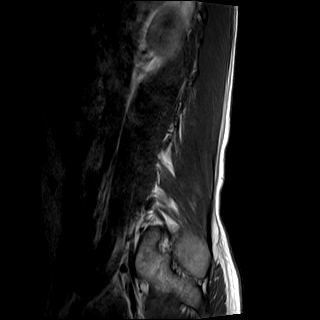
[im 3/17]
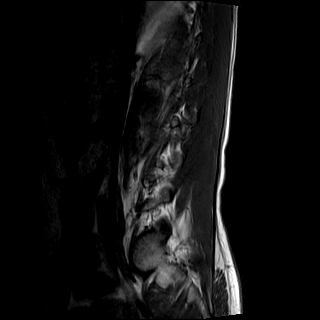
[im 6/17]
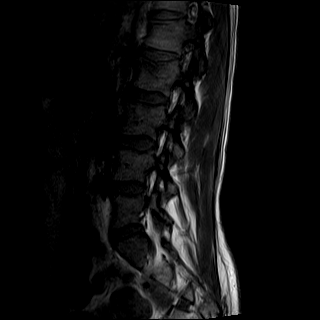
[im 9/17]
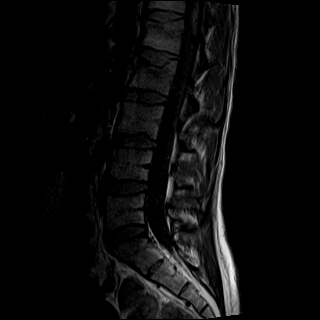
[im 11/17]
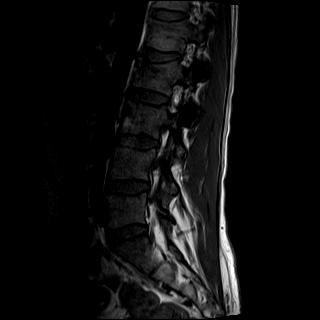
[im 14/17]
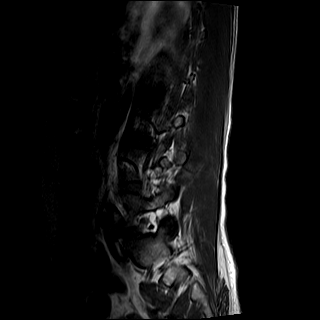
[im 17/17]
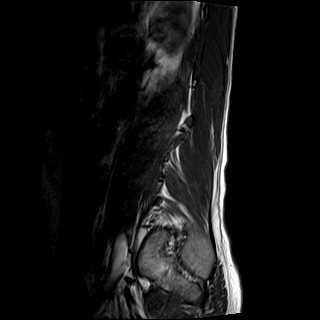

[Series 38: T2 · axial · 4.5mm · 0.57mm/px · z∈[-577,-363]mm · 8 of 36 slices shown (2 of 2)]
[im 1/36]
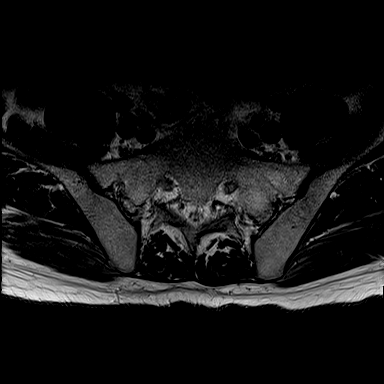
[im 6/36]
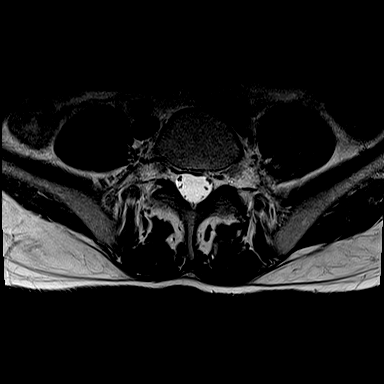
[im 11/36]
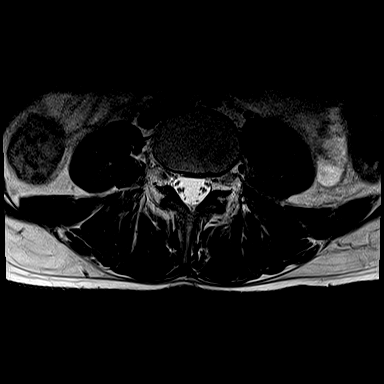
[im 17/36]
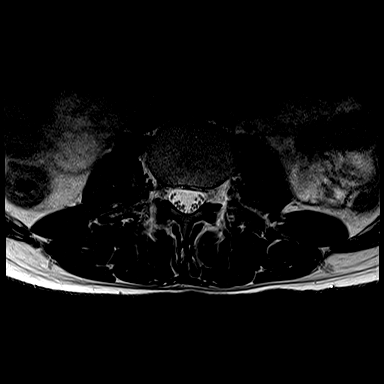
[im 19/36]
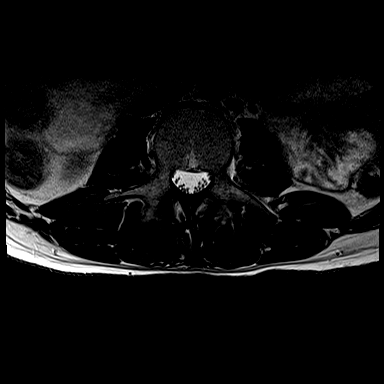
[im 25/36]
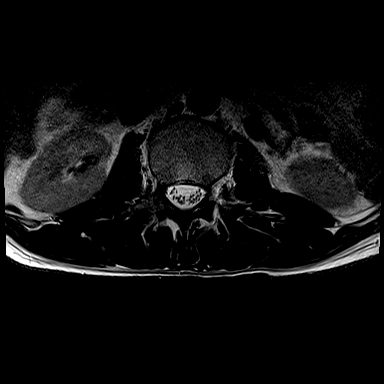
[im 30/36]
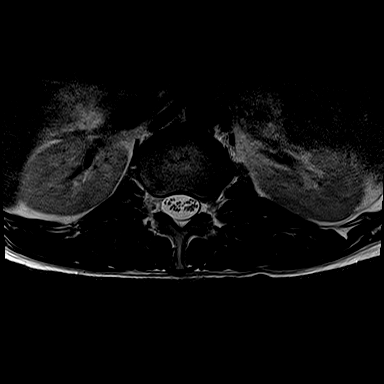
[im 36/36]
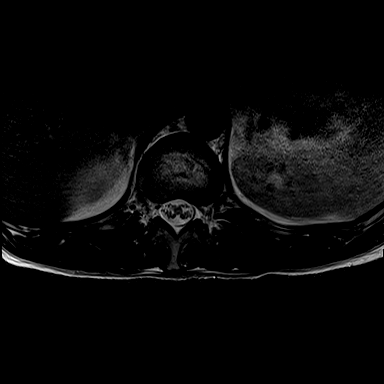

[Series 40: T1 · axial · 4.5mm · 0.34mm/px · z∈[-577,-396]mm · 4 of 36 slices shown (2 of 2)]
[im 1/36]
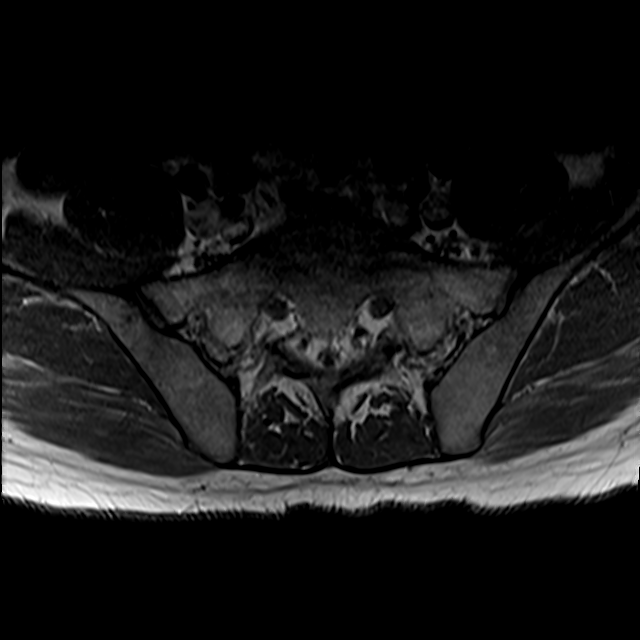
[im 6/36]
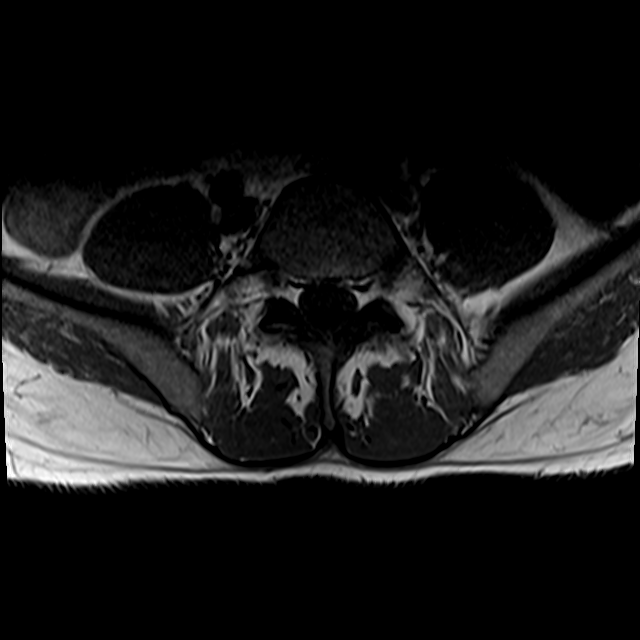
[im 19/36]
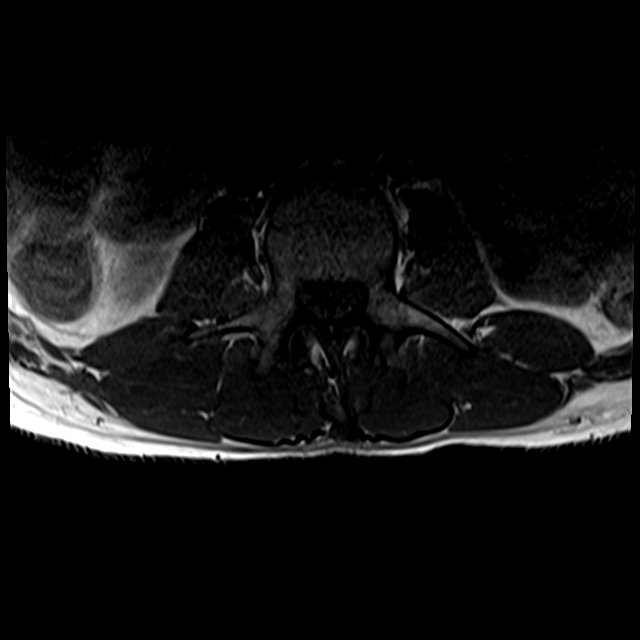
[im 30/36]
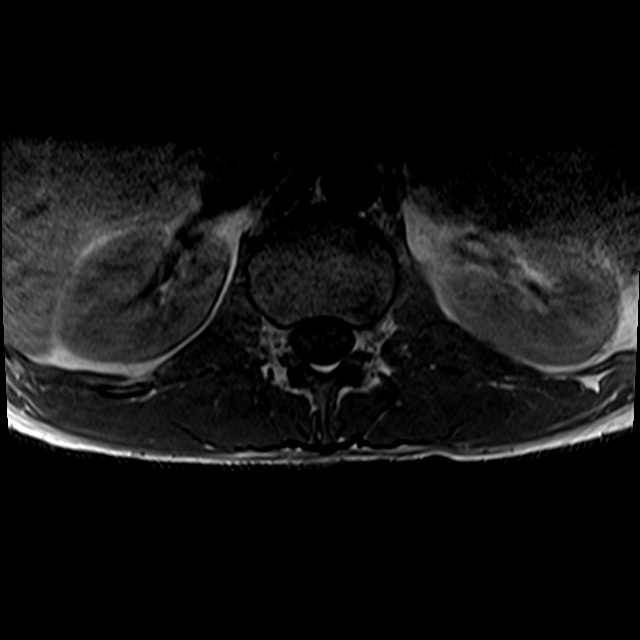

[25 of 48 positions shown; findings below may reference images not displayed]

FINDINGS: MRI CERVICAL SPINE FINDINGS

Alignment: Physiologic with preservation of the normal cervical
lordosis. No listhesis.

Vertebrae: Vertebral body height maintained without acute or chronic
fracture. Bone marrow signal intensity diffusely decreased on T1
weighted sequence, nonspecific, but most commonly related to anemia,
smoking or obesity. No worrisome osseous lesions or abnormal marrow
edema.

Cord: Normal signal and morphology. No evidence for traumatic cord
injury. Ligamentous structures intact.

Posterior Fossa, vertebral arteries, paraspinal tissues:
Unremarkable.

Disc levels:

C5-6: Mild disc bulge. No significant spinal stenosis. Foramina
remain patent.

C6-7: Mild disc bulge with bilateral uncovertebral hypertrophy. No
significant spinal stenosis. Moderate bilateral C7 foraminal
stenosis.

C7-T1: Negative interspace. Mild right-sided facet hypertrophy. No
stenosis.

MRI THORACIC SPINE FINDINGS

Alignment:  Physiologic.  No listhesis.

Vertebrae: Mild chronic compression deformities with height loss
seen at the superior endplates of T1 through T5. No associated bony
retropulsion. Otherwise, vertebral body height maintained. No acute
fracture. Bone marrow signal intensity diffusely decreased on T1
weighted sequence, nonspecific, but most commonly related to anemia,
smoking or obesity. No discrete or worrisome osseous lesions. No
abnormal marrow edema.

Cord: Normal signal and morphology. Ligamentous structures are
intact.

Paraspinal and other soft tissues: Unremarkable.

Disc levels:

Unremarkable. No significant disc pathology. No stenosis or
impingement.

MRI LUMBAR SPINE FINDINGS

Segmentation: Standard. Lowest well-formed disc space labeled the
L5-S1 level.

Alignment: Physiologic with preservation of the normal lumbar
lordosis. No listhesis.

Vertebrae: Acute compression fractures involving the superior
endplates of L1 and L2 again seen. Associated mild 20-25% height
loss without bony retropulsion. Vertebral body height otherwise
maintained. Bone marrow signal intensity diffusely decreased on T1
weighted sequence, nonspecific, but most commonly related to anemia,
smoking or obesity. No discrete or worrisome osseous lesions. No
other abnormal marrow edema.

Conus medullaris and cauda equina: Conus extends to the T12-L1
level. Conus and cauda equina appear normal.

Paraspinal and other soft tissues: Mild paraspinous edema adjacent
to the acute L1 and L2 compression fractures. Visualized paraspinous
soft tissues otherwise unremarkable. Visualized visceral structures
within normal limits.

Disc levels:

No significant disc pathology seen within the lumbar spine. No disc
bulge or focal disc herniation. Minimal lower lumbar facet
hypertrophy. No stenosis or impingement.
IMPRESSION: 1. Acute compression fractures involving the superior endplates of
L1 and L2 with up to 20-25% height loss without bony retropulsion.
2. No other acute traumatic injury within the cervical, thoracic, or
lumbar spine.
3. Mild degenerative spondylosis at C5-6 and C6-7 without
significant spinal stenosis. Associated moderate bilateral C7
foraminal narrowing.

## 2021-03-19 IMAGING — CT CT T SPINE W/O CM
3 of 4 series · 12 of 33 positions shown, 14 images · IV contrast (agent unspecified)
Comparison: None.

CLINICAL DATA: Polytrauma, fall

EXAM:
CT CHEST, ABDOMEN, AND PELVIS WITH CONTRAST
CT THORACIC AND LUMBAR SPINE WITH CONTRAST
TECHNIQUE: Multidetector CT imaging of the chest, abdomen and pelvis was
performed following the standard protocol during bolus
administration of intravenous contrast.

[Series 1: t spine cor · coronal · 0.39mm/px · 3 of 40 slices shown]
[im 8/40  bone]
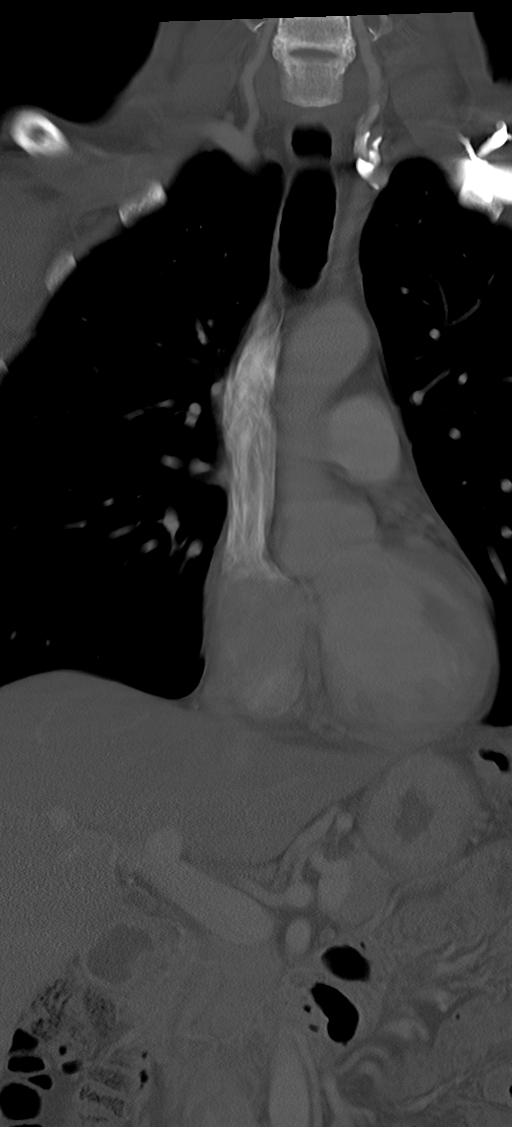
[im 16/40  bone]
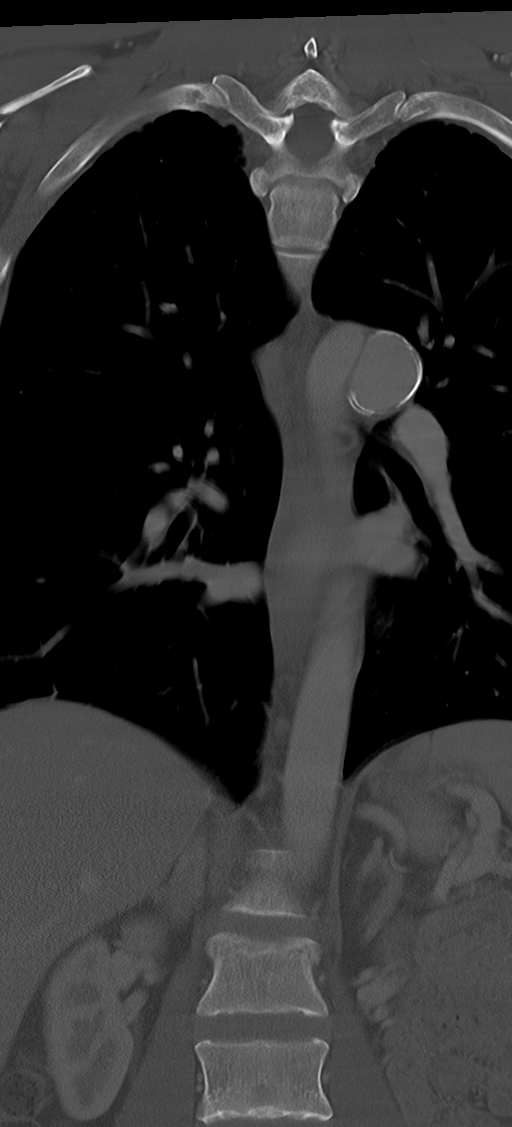
[im 24/40  bone]
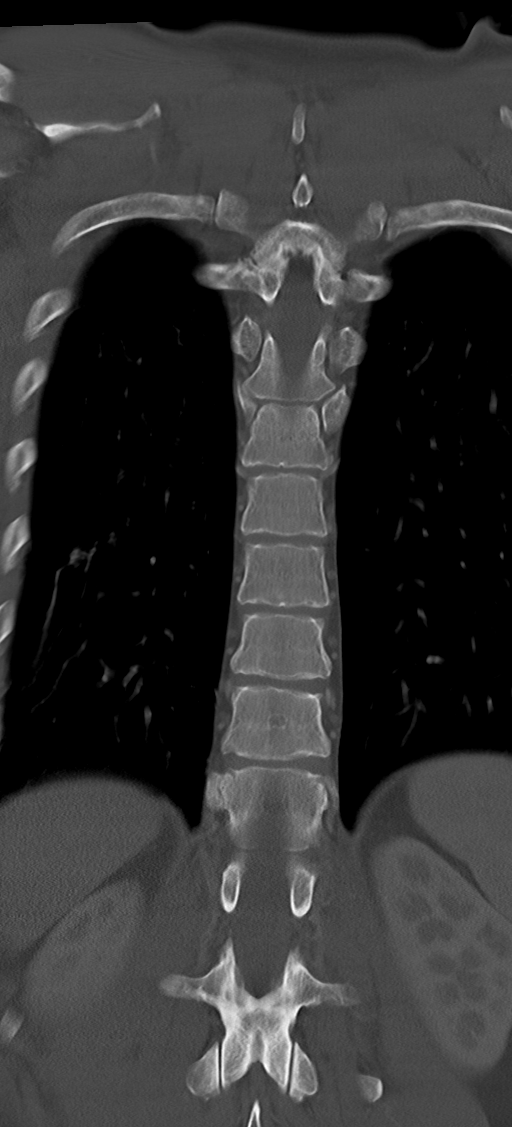

[Series 3: t spine axial · axial · 0.38mm/px · z∈[-474,-388]mm · 2 of 172 slices shown]
[im 43/172  bone]
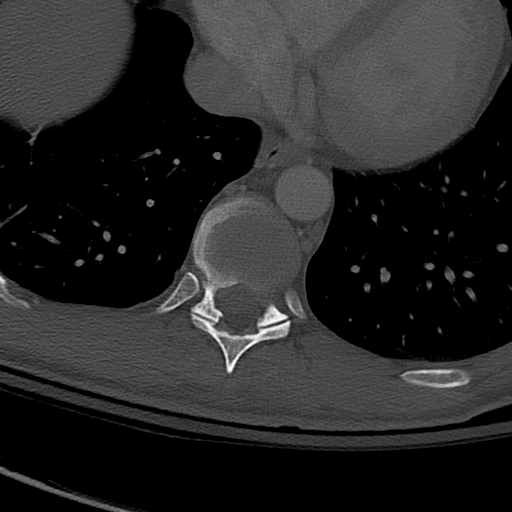
[im 86/172  bone]
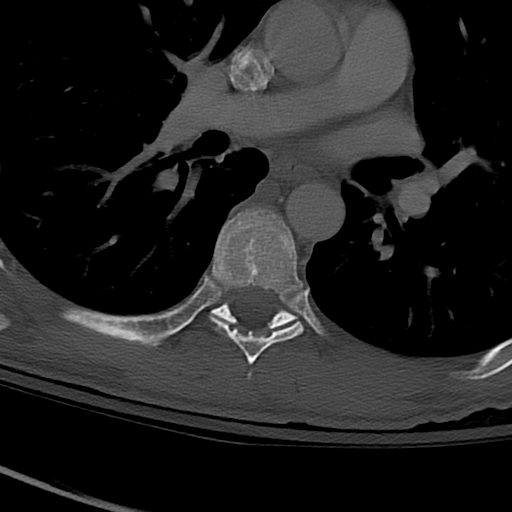

[Series 6: t spine axialthin · axial · 0.38mm/px · z∈[-516,-259]mm · 7 of 343 slices shown, 9 images]
[im 43/343  soft-tissue]
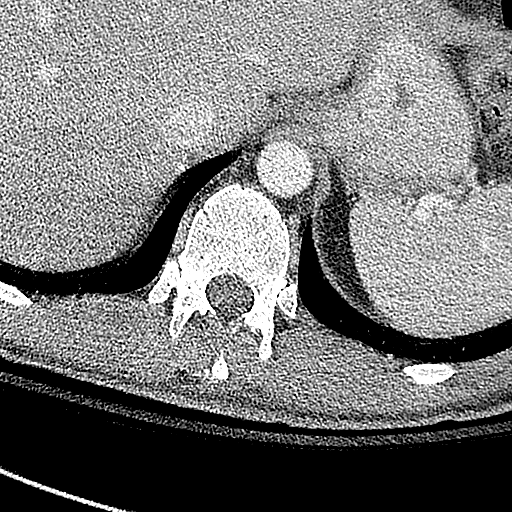
[im 43/343  bone]
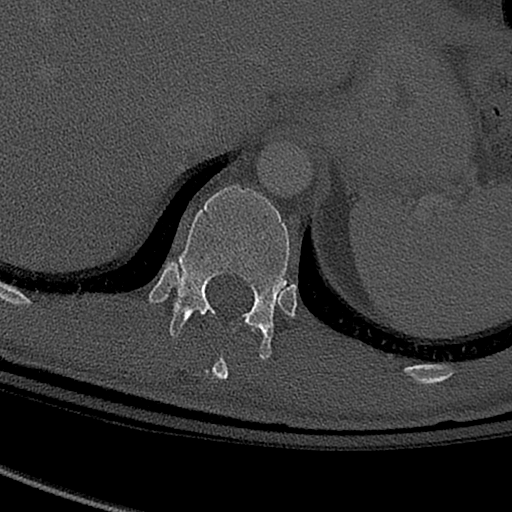
[im 86/343  bone]
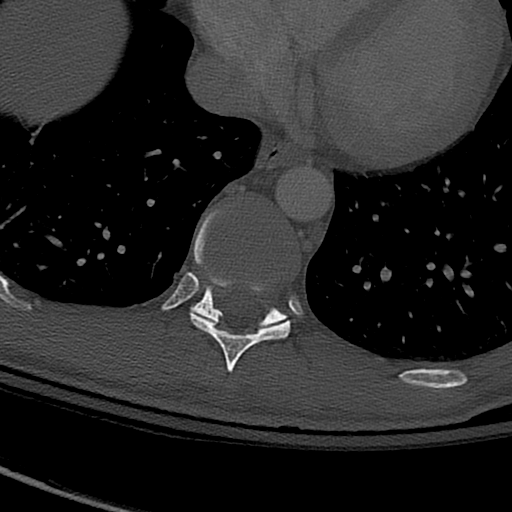
[im 129/343  bone]
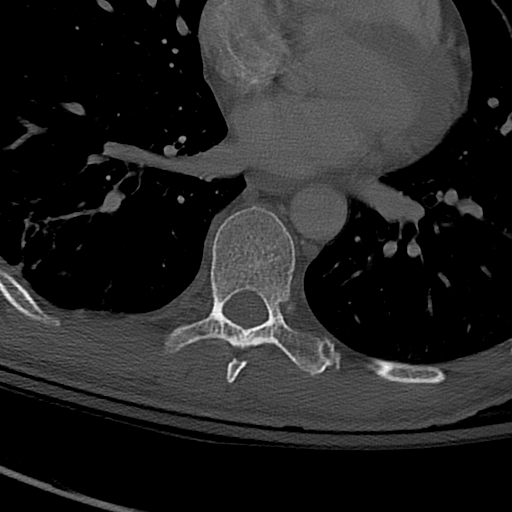
[im 172/343  bone]
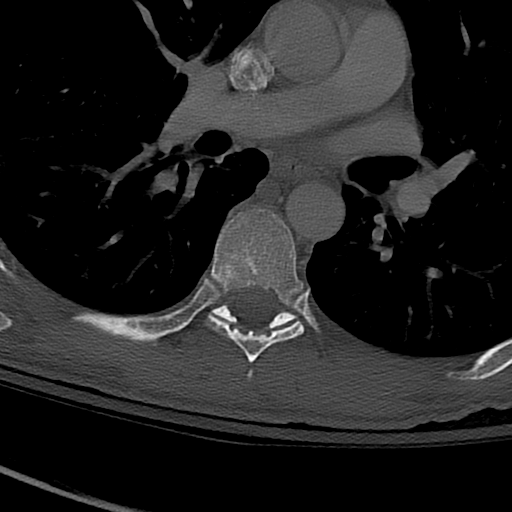
[im 214/343  soft-tissue]
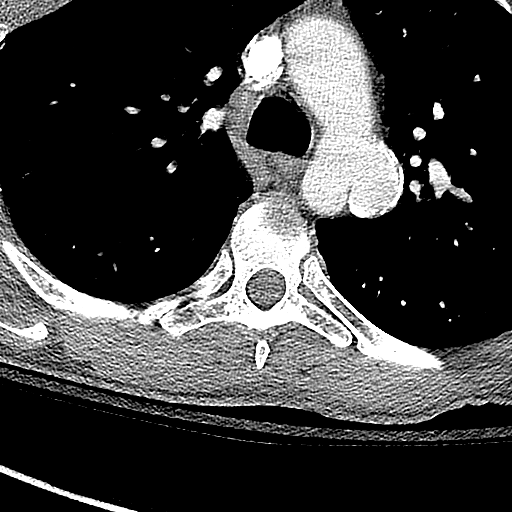
[im 214/343  bone]
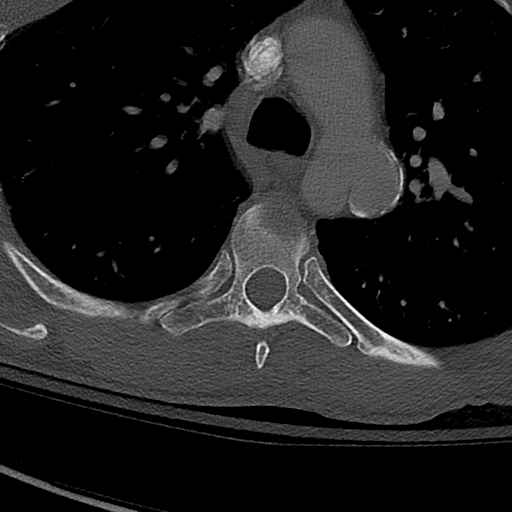
[im 257/343  bone]
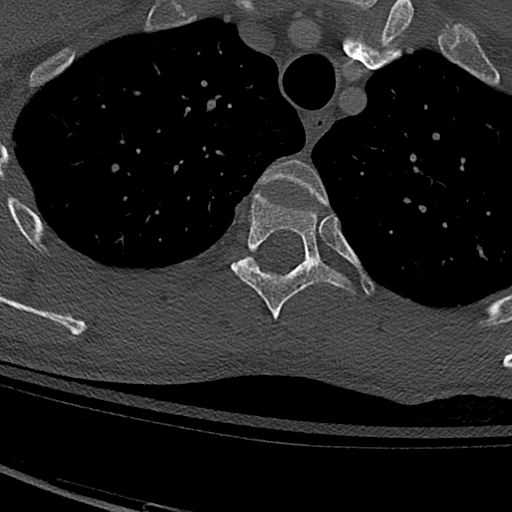
[im 300/343  bone]
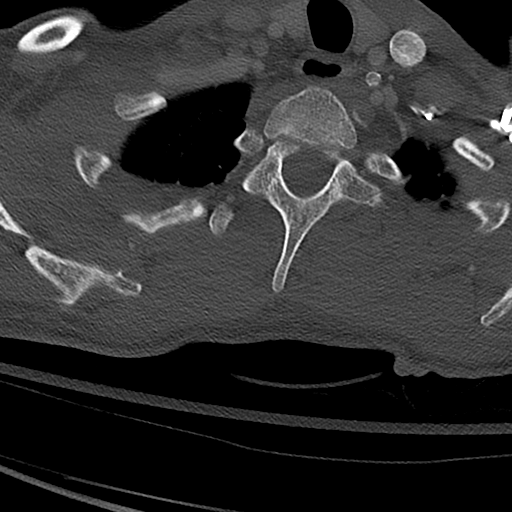

[12 of 33 positions shown; findings below may reference images not displayed]

Multidetector CT imaging of the thoracic and lumbar spine was
performed following the standard protocol during bolus
administration of intravenous contrast. Dedicated multiplanar
reformats were generated and reviewed.

RADIATION DOSE REDUCTION: This exam was performed according to the
departmental dose-optimization program which includes automated
exposure control, adjustment of the mA and/or kV according to
patient size and/or use of iterative reconstruction technique.

CONTRAST:  100mL OMNIPAQUE IOHEXOL 350 MG/ML SOLN
FINDINGS: CT CHEST FINDINGS

Cardiovascular: Incidental calcified aneurysm or chronic focal
dissection at the left aspect of the distal aortic arch, maximum
caliber of the vessel at this site 4.0 x 3.3 cm (series 4, image 46,
series 1, image 28). Normal heart size. No pericardial effusion.

Mediastinum/Nodes: No enlarged mediastinal, hilar, or axillary lymph
nodes. Thyroid gland, trachea, and esophagus demonstrate no
significant findings.

Lungs/Pleura: Mild paraseptal emphysema. No pleural effusion or
pneumothorax.

Musculoskeletal: No chest wall mass or suspicious osseous lesions
identified.

CT ABDOMEN PELVIS FINDINGS

Hepatobiliary: No solid liver abnormality is seen. No gallstones,
gallbladder wall thickening, or biliary dilatation.

Pancreas: Unremarkable. No pancreatic ductal dilatation or
surrounding inflammatory changes.

Spleen: Normal in size without significant abnormality.

Adrenals/Urinary Tract: Adrenal glands are unremarkable. Kidneys are
normal, without renal calculi, solid lesion, or hydronephrosis.
Bladder is unremarkable.

Stomach/Bowel: Stomach is within normal limits. Appendix appears
normal. No evidence of bowel wall thickening, distention, or
inflammatory changes.

Vascular/Lymphatic: Right femoral venous catheter. No enlarged
abdominal or pelvic lymph nodes.

Reproductive: No mass or other abnormality.

Other: Status post umbilical hernia repair.  No ascites.

Musculoskeletal: No acute osseous findings.

CT THORACIC AND LUMBAR SPINE FINDINGS

Alignment: Normal.

Vertebral bodies: Acute superior endplate wedge fractures of L1 and
L2 with less than 25% anterior height loss (series 5, image 56).

Disc spaces: Intact.

Soft tissues: Unremarkable.  No evident hematoma.
IMPRESSION: 1. Acute superior endplate wedge fractures of L1 and L2 with less
than 25% anterior height loss. No other fracture or dislocation
identified.
2. No evidence of acute traumatic injury to the organs of the chest,
abdomen, or pelvis.
3. Incidental note of a focal, saccular aneurysm or chronic focal
dissection at the left aspect of the distal aortic arch, maximum
caliber of the vessel at this site 4.0 x 3.3 cm. Although this does
not reflect acute traumatic vascular injury, recommend referral to
vascular specialist on a nonemergent, outpatient basis. This
recommendation follows [1N]
ACCF/AHA/AATS/ACR/ASA/SCA/SUFO/SUFO/SUFO/SUFO Guidelines for the
Diagnosis and Management of Patients With Thoracic Aortic Disease.
Circulation. [1N]; 121: [id number]. Aortic aneurysm NOS ([1N]-[1N])
4. Mild emphysema.

Acute findings were called by telephone at the time of
interpretation on [DATE] at [DATE] to Dr. SUFO, Who
verbally acknowledged these results.

Emphysema ([1N]-[1N]).

## 2021-03-19 IMAGING — MR MR CERVICAL SPINE W/O CM
5 series · 34 of 48 positions shown · non-contrast
Comparison: Prior CT from earlier the same day.

CLINICAL DATA: Initial evaluation for acute trauma, fall.

EXAM:
MRI CERVICAL, THORACIC AND LUMBAR SPINE WITHOUT CONTRAST
TECHNIQUE: Multiplanar and multiecho pulse sequences of the cervical spine, to
include the craniocervical junction and cervicothoracic junction,
and thoracic and lumbar spine, were obtained without intravenous
contrast.

[Series 5: T2 · sagittal · 3.0mm · 0.69mm/px · 6 of 15 slices shown (1 of 2)]
[im 1/15]
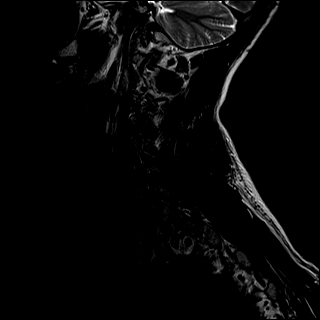
[im 3/15]
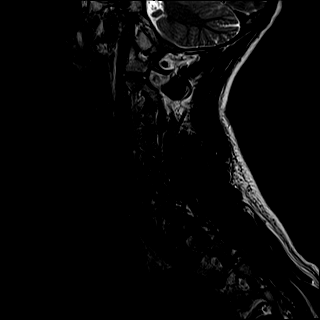
[im 6/15]
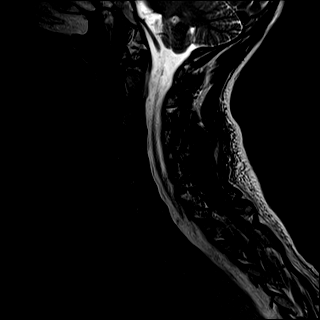
[im 9/15]
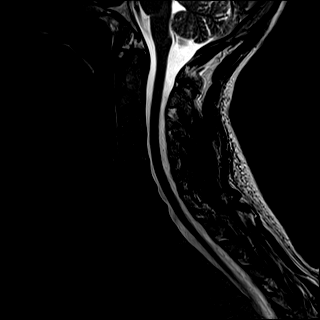
[im 12/15]
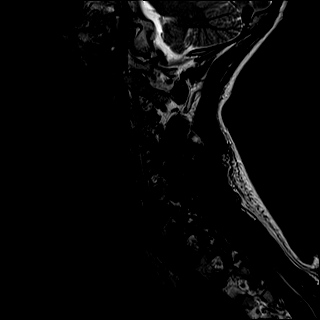
[im 15/15]
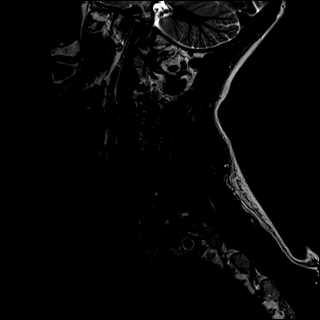

[Series 6: T1 · sagittal · 3.0mm · 0.69mm/px · 6 of 15 slices shown]
[im 1/15]
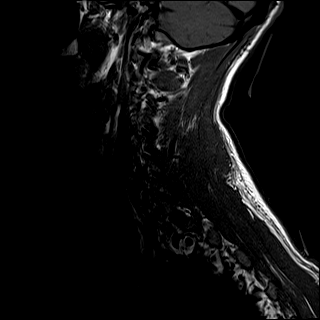
[im 3/15]
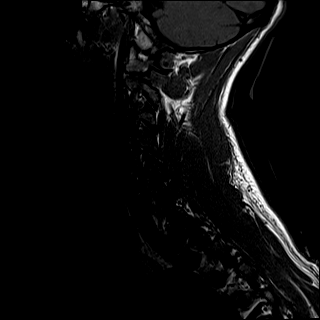
[im 6/15]
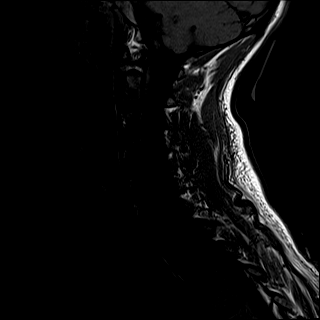
[im 9/15]
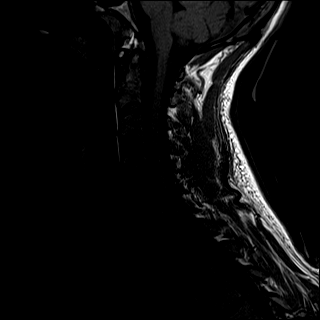
[im 12/15]
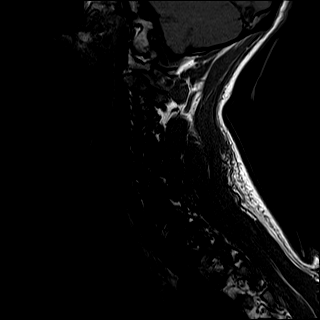
[im 15/15]
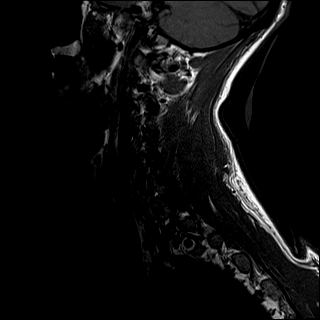

[Series 8: T2 · axial · 3.5mm · 0.66mm/px · z∈[-119,+26]mm · 9 of 40 slices shown (2 of 2)]
[im 1/40]
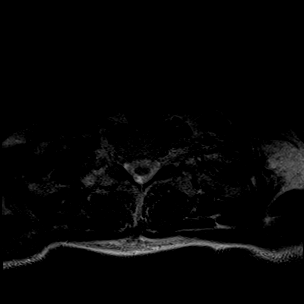
[im 6/40]
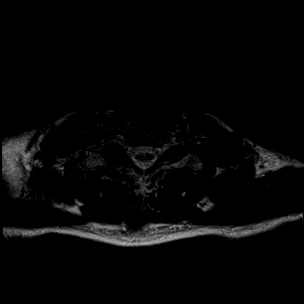
[im 12/40]
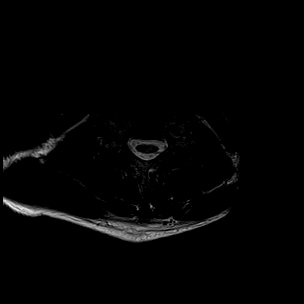
[im 17/40]
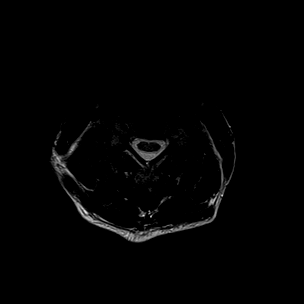
[im 20/40]
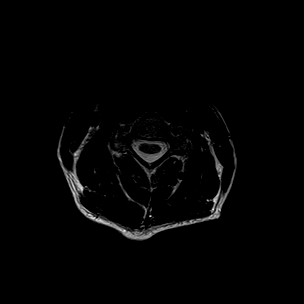
[im 23/40]
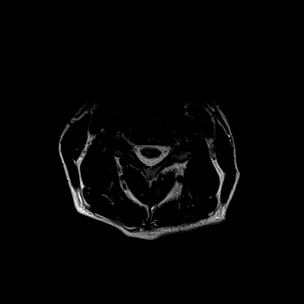
[im 28/40]
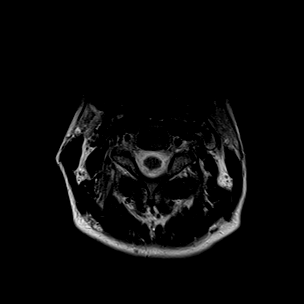
[im 34/40]
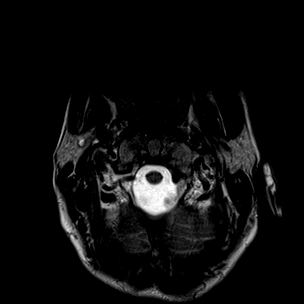
[im 40/40]
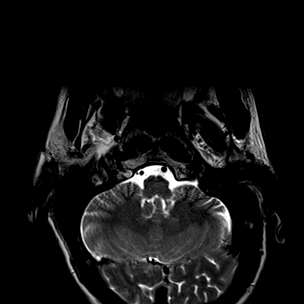

[Series 10: STIR · sagittal · 3.0mm · 0.86mm/px · 6 of 15 slices shown]
[im 1/15]
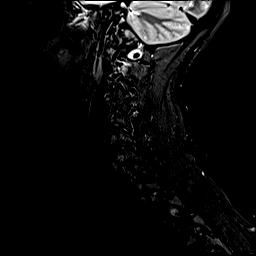
[im 3/15]
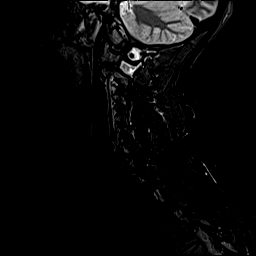
[im 6/15]
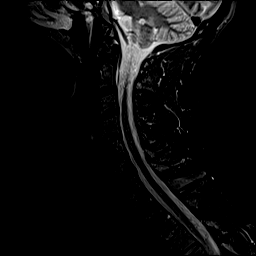
[im 9/15]
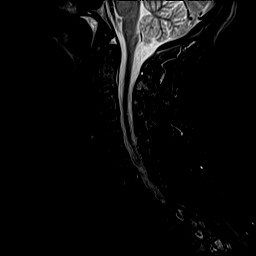
[im 12/15]
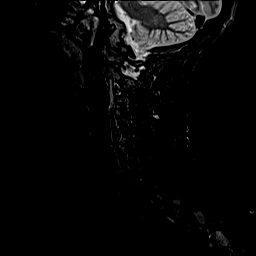
[im 15/15]
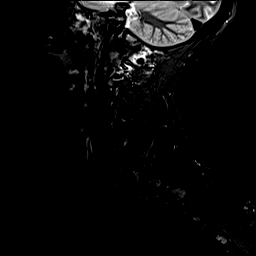

[Series 11: GRE · axial · 3.5mm · 0.39mm/px · z∈[-119,+3]mm · 7 of 40 slices shown]
[im 1/40]
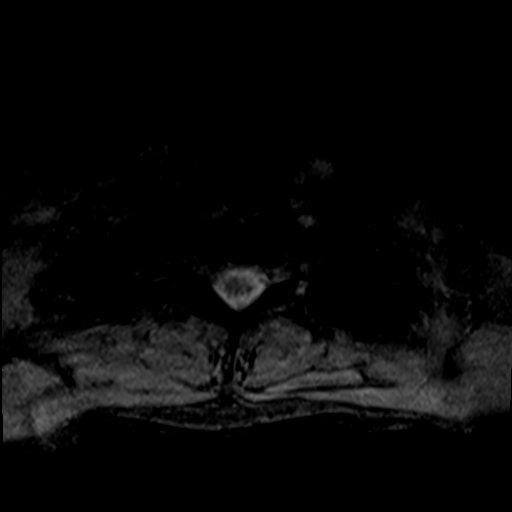
[im 6/40]
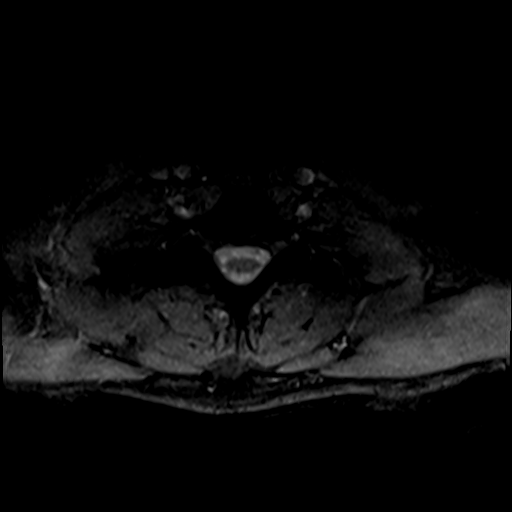
[im 12/40]
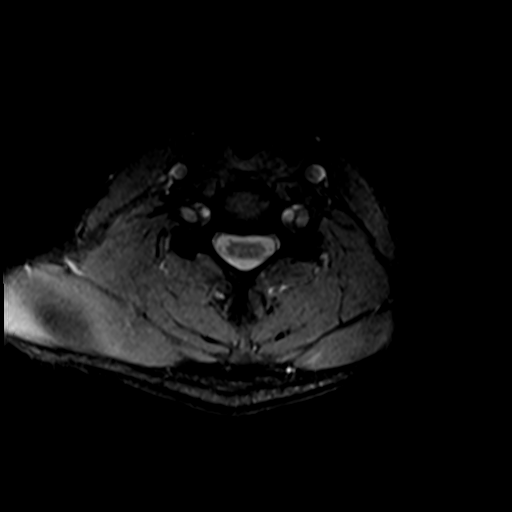
[im 17/40]
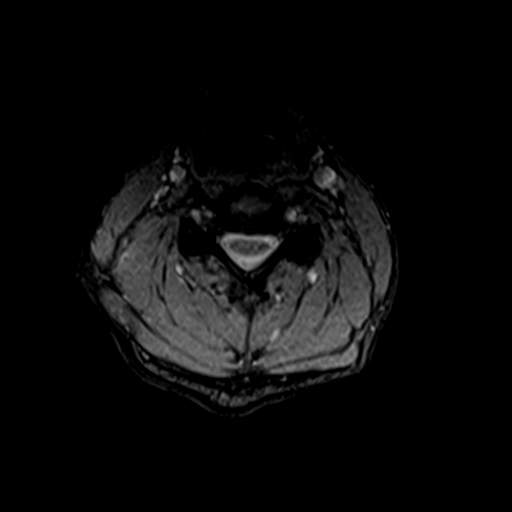
[im 23/40]
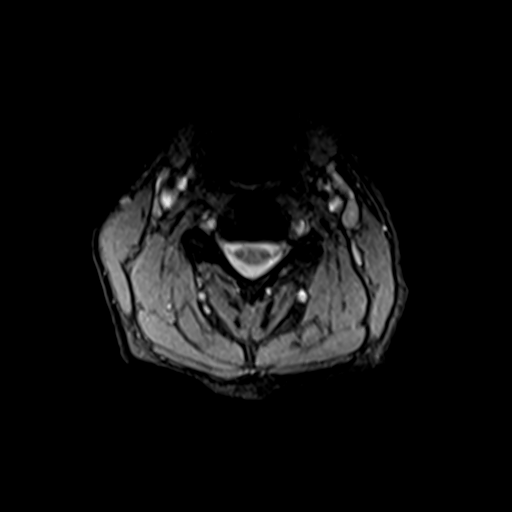
[im 28/40]
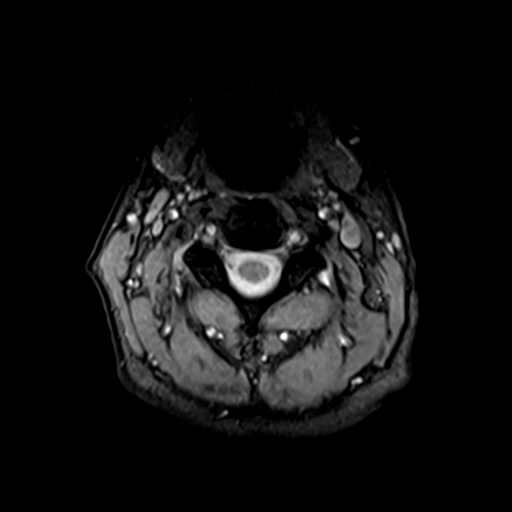
[im 34/40]
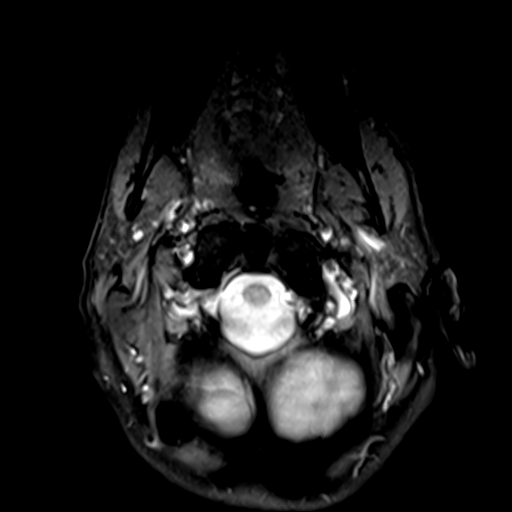

[34 of 48 positions shown; findings below may reference images not displayed]

FINDINGS: MRI CERVICAL SPINE FINDINGS

Alignment: Physiologic with preservation of the normal cervical
lordosis. No listhesis.

Vertebrae: Vertebral body height maintained without acute or chronic
fracture. Bone marrow signal intensity diffusely decreased on T1
weighted sequence, nonspecific, but most commonly related to anemia,
smoking or obesity. No worrisome osseous lesions or abnormal marrow
edema.

Cord: Normal signal and morphology. No evidence for traumatic cord
injury. Ligamentous structures intact.

Posterior Fossa, vertebral arteries, paraspinal tissues:
Unremarkable.

Disc levels:

C5-6: Mild disc bulge. No significant spinal stenosis. Foramina
remain patent.

C6-7: Mild disc bulge with bilateral uncovertebral hypertrophy. No
significant spinal stenosis. Moderate bilateral C7 foraminal
stenosis.

C7-T1: Negative interspace. Mild right-sided facet hypertrophy. No
stenosis.

MRI THORACIC SPINE FINDINGS

Alignment:  Physiologic.  No listhesis.

Vertebrae: Mild chronic compression deformities with height loss
seen at the superior endplates of T1 through T5. No associated bony
retropulsion. Otherwise, vertebral body height maintained. No acute
fracture. Bone marrow signal intensity diffusely decreased on T1
weighted sequence, nonspecific, but most commonly related to anemia,
smoking or obesity. No discrete or worrisome osseous lesions. No
abnormal marrow edema.

Cord: Normal signal and morphology. Ligamentous structures are
intact.

Paraspinal and other soft tissues: Unremarkable.

Disc levels:

Unremarkable. No significant disc pathology. No stenosis or
impingement.

MRI LUMBAR SPINE FINDINGS

Segmentation: Standard. Lowest well-formed disc space labeled the
L5-S1 level.

Alignment: Physiologic with preservation of the normal lumbar
lordosis. No listhesis.

Vertebrae: Acute compression fractures involving the superior
endplates of L1 and L2 again seen. Associated mild 20-25% height
loss without bony retropulsion. Vertebral body height otherwise
maintained. Bone marrow signal intensity diffusely decreased on T1
weighted sequence, nonspecific, but most commonly related to anemia,
smoking or obesity. No discrete or worrisome osseous lesions. No
other abnormal marrow edema.

Conus medullaris and cauda equina: Conus extends to the T12-L1
level. Conus and cauda equina appear normal.

Paraspinal and other soft tissues: Mild paraspinous edema adjacent
to the acute L1 and L2 compression fractures. Visualized paraspinous
soft tissues otherwise unremarkable. Visualized visceral structures
within normal limits.

Disc levels:

No significant disc pathology seen within the lumbar spine. No disc
bulge or focal disc herniation. Minimal lower lumbar facet
hypertrophy. No stenosis or impingement.
IMPRESSION: 1. Acute compression fractures involving the superior endplates of
L1 and L2 with up to 20-25% height loss without bony retropulsion.
2. No other acute traumatic injury within the cervical, thoracic, or
lumbar spine.
3. Mild degenerative spondylosis at C5-6 and C6-7 without
significant spinal stenosis. Associated moderate bilateral C7
foraminal narrowing.

## 2021-03-19 IMAGING — MR MR THORACIC SPINE W/O CM
5 of 6 series · 23 of 48 positions shown · non-contrast
Comparison: Prior CT from earlier the same day.

CLINICAL DATA: Initial evaluation for acute trauma, fall.

EXAM:
MRI CERVICAL, THORACIC AND LUMBAR SPINE WITHOUT CONTRAST
TECHNIQUE: Multiplanar and multiecho pulse sequences of the cervical spine, to
include the craniocervical junction and cervicothoracic junction,
and thoracic and lumbar spine, were obtained without intravenous
contrast.

[Series 25: T1 · sagittal · 6.0mm · 1.29mm/px · 2 of 9 slices shown (1 of 2)]
[im 1/9]
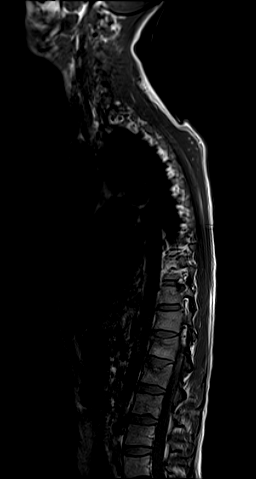
[im 9/9]
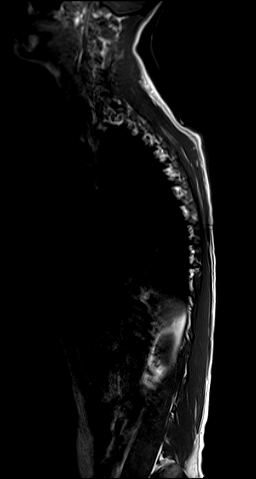

[Series 26: T2 · sagittal · 3.0mm · 0.76mm/px · 6 of 17 slices shown (1 of 2)]
[im 1/17]
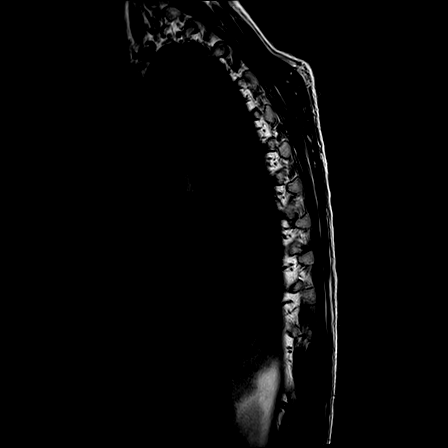
[im 4/17]
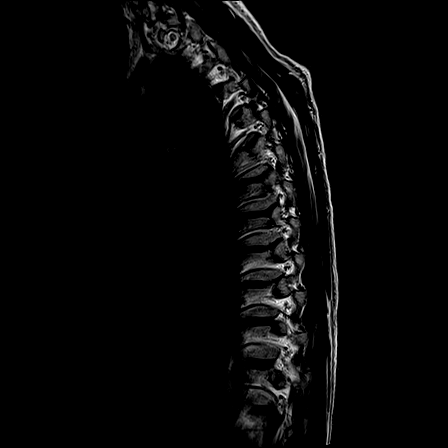
[im 7/17]
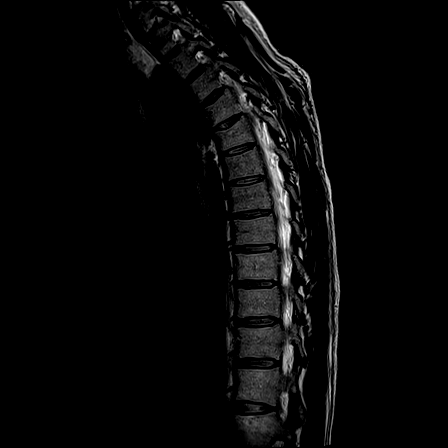
[im 10/17]
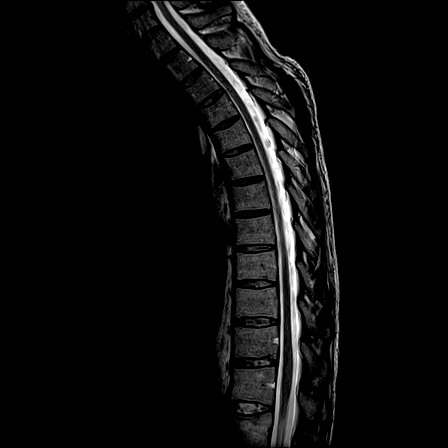
[im 13/17]
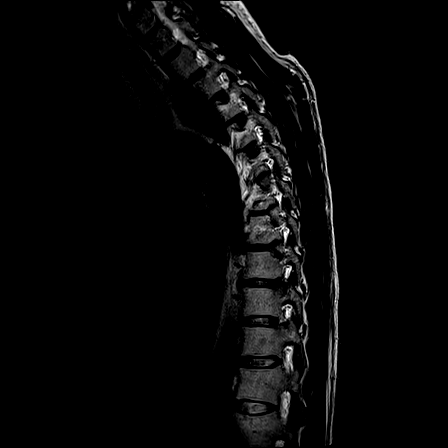
[im 17/17]
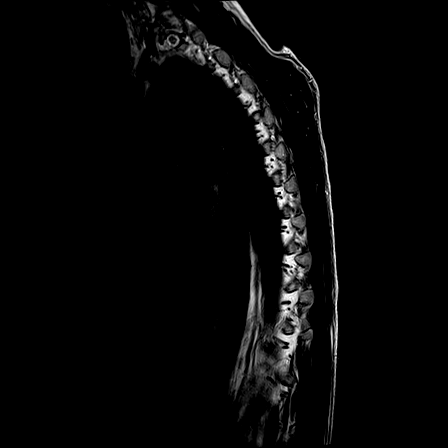

[Series 27: T1 · sagittal · 3.0mm · 0.76mm/px · 6 of 17 slices shown (2 of 2)]
[im 1/17]
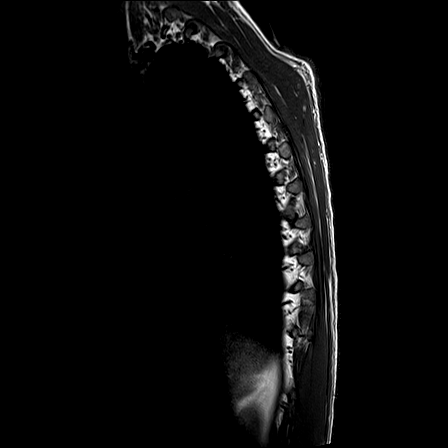
[im 4/17]
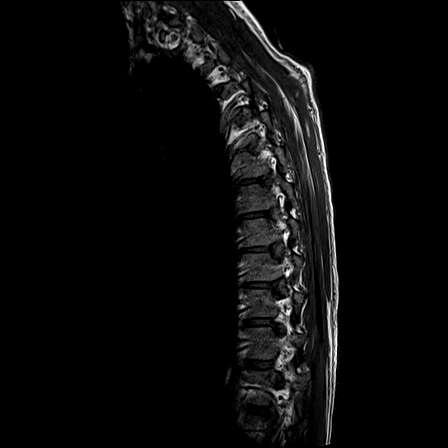
[im 7/17]
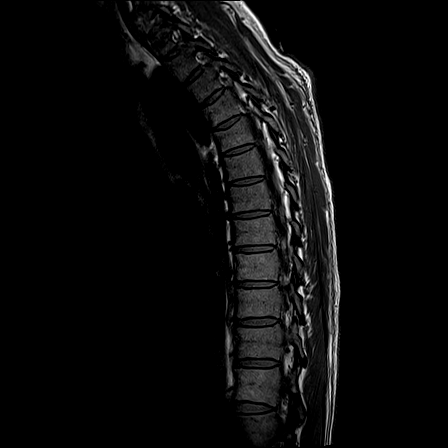
[im 10/17]
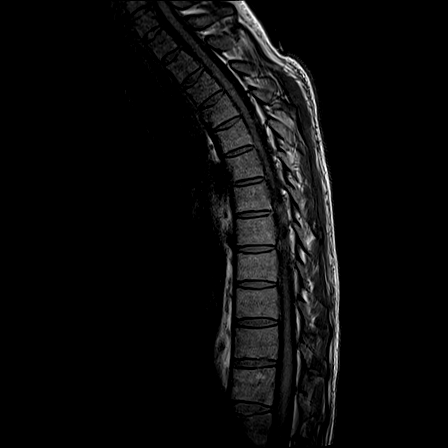
[im 13/17]
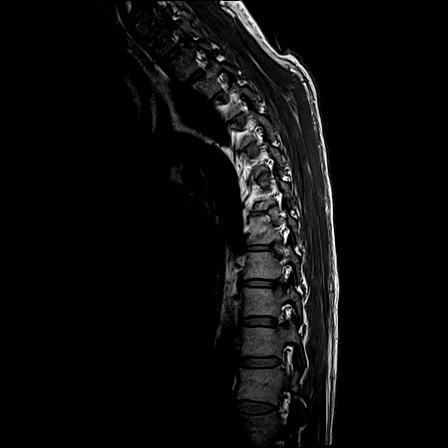
[im 17/17]
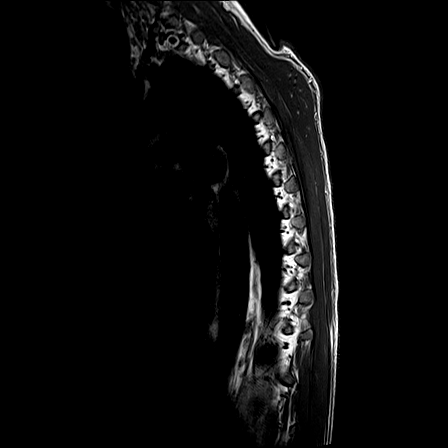

[Series 28: STIR · sagittal · 3.0mm · 0.38mm/px · 1 of 17 slices shown]
[im 1/17]
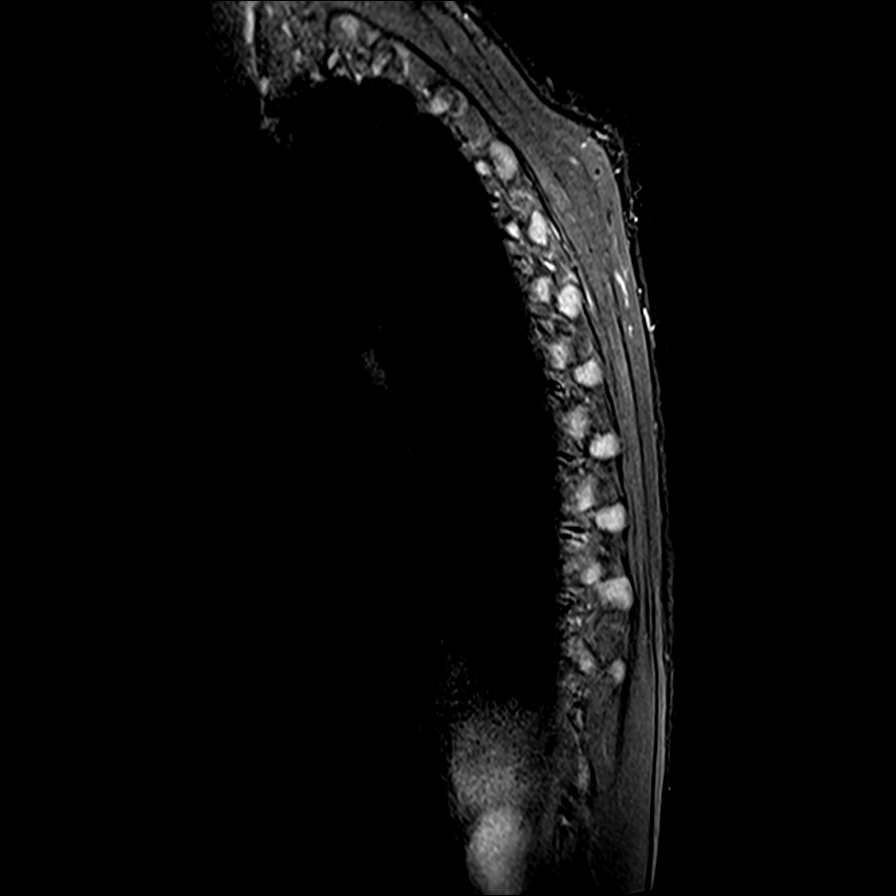

[Series 29: T2 · axial · 4.0mm · 0.59mm/px · z∈[-365,-121]mm · 8 of 39 slices shown (2 of 2)]
[im 1/39]
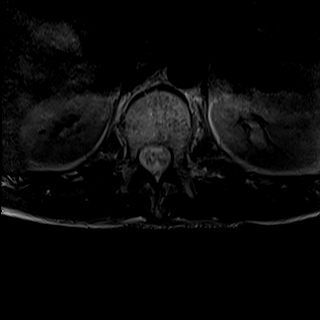
[im 6/39]
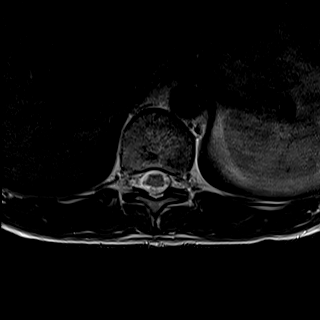
[im 12/39]
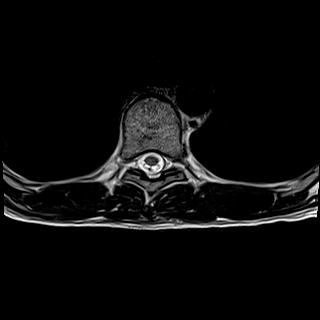
[im 18/39]
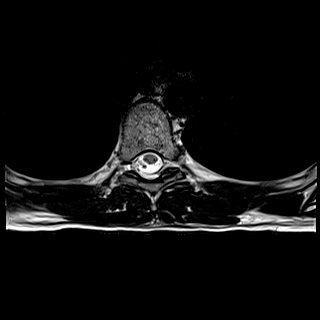
[im 21/39]
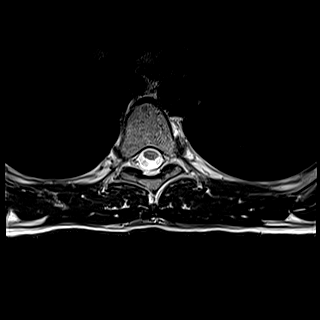
[im 27/39]
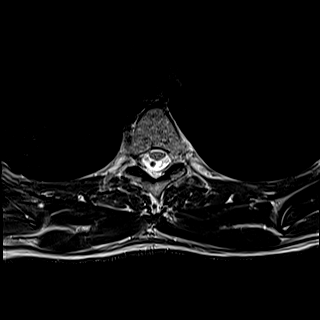
[im 33/39]
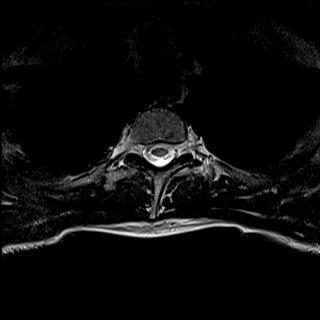
[im 39/39]
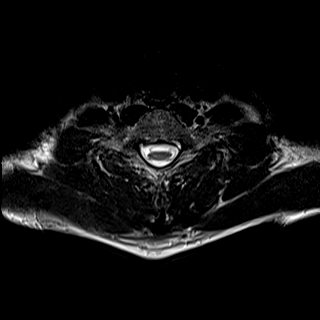

[23 of 48 positions shown; findings below may reference images not displayed]

FINDINGS: MRI CERVICAL SPINE FINDINGS

Alignment: Physiologic with preservation of the normal cervical
lordosis. No listhesis.

Vertebrae: Vertebral body height maintained without acute or chronic
fracture. Bone marrow signal intensity diffusely decreased on T1
weighted sequence, nonspecific, but most commonly related to anemia,
smoking or obesity. No worrisome osseous lesions or abnormal marrow
edema.

Cord: Normal signal and morphology. No evidence for traumatic cord
injury. Ligamentous structures intact.

Posterior Fossa, vertebral arteries, paraspinal tissues:
Unremarkable.

Disc levels:

C5-6: Mild disc bulge. No significant spinal stenosis. Foramina
remain patent.

C6-7: Mild disc bulge with bilateral uncovertebral hypertrophy. No
significant spinal stenosis. Moderate bilateral C7 foraminal
stenosis.

C7-T1: Negative interspace. Mild right-sided facet hypertrophy. No
stenosis.

MRI THORACIC SPINE FINDINGS

Alignment:  Physiologic.  No listhesis.

Vertebrae: Mild chronic compression deformities with height loss
seen at the superior endplates of T1 through T5. No associated bony
retropulsion. Otherwise, vertebral body height maintained. No acute
fracture. Bone marrow signal intensity diffusely decreased on T1
weighted sequence, nonspecific, but most commonly related to anemia,
smoking or obesity. No discrete or worrisome osseous lesions. No
abnormal marrow edema.

Cord: Normal signal and morphology. Ligamentous structures are
intact.

Paraspinal and other soft tissues: Unremarkable.

Disc levels:

Unremarkable. No significant disc pathology. No stenosis or
impingement.

MRI LUMBAR SPINE FINDINGS

Segmentation: Standard. Lowest well-formed disc space labeled the
L5-S1 level.

Alignment: Physiologic with preservation of the normal lumbar
lordosis. No listhesis.

Vertebrae: Acute compression fractures involving the superior
endplates of L1 and L2 again seen. Associated mild 20-25% height
loss without bony retropulsion. Vertebral body height otherwise
maintained. Bone marrow signal intensity diffusely decreased on T1
weighted sequence, nonspecific, but most commonly related to anemia,
smoking or obesity. No discrete or worrisome osseous lesions. No
other abnormal marrow edema.

Conus medullaris and cauda equina: Conus extends to the T12-L1
level. Conus and cauda equina appear normal.

Paraspinal and other soft tissues: Mild paraspinous edema adjacent
to the acute L1 and L2 compression fractures. Visualized paraspinous
soft tissues otherwise unremarkable. Visualized visceral structures
within normal limits.

Disc levels:

No significant disc pathology seen within the lumbar spine. No disc
bulge or focal disc herniation. Minimal lower lumbar facet
hypertrophy. No stenosis or impingement.
IMPRESSION: 1. Acute compression fractures involving the superior endplates of
L1 and L2 with up to 20-25% height loss without bony retropulsion.
2. No other acute traumatic injury within the cervical, thoracic, or
lumbar spine.
3. Mild degenerative spondylosis at C5-6 and C6-7 without
significant spinal stenosis. Associated moderate bilateral C7
foraminal narrowing.

## 2021-03-19 IMAGING — CT CT L SPINE W/O CM
3 of 4 series · 12 of 33 positions shown, 14 images · IV contrast (agent unspecified)
Comparison: None.

CLINICAL DATA: Polytrauma, fall

EXAM:
CT CHEST, ABDOMEN, AND PELVIS WITH CONTRAST
CT THORACIC AND LUMBAR SPINE WITH CONTRAST
TECHNIQUE: Multidetector CT imaging of the chest, abdomen and pelvis was
performed following the standard protocol during bolus
administration of intravenous contrast.

[Series 8: l spine cor · coronal · 0.39mm/px · 3 of 40 slices shown]
[im 8/40  bone]
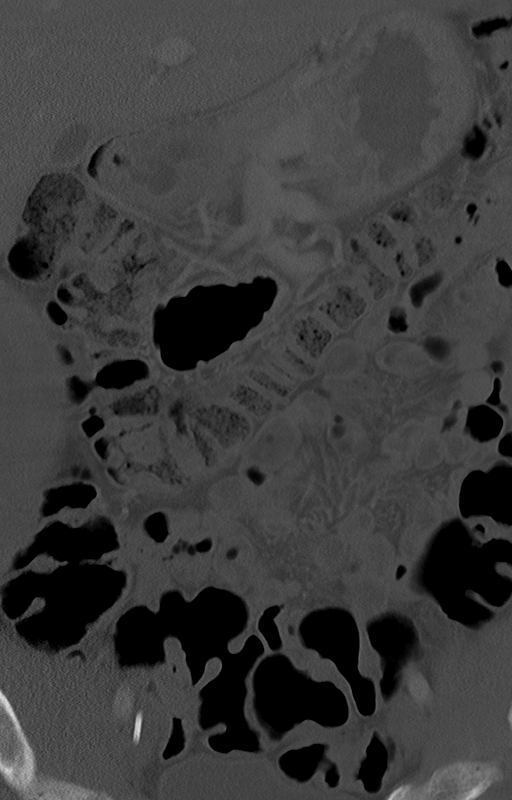
[im 16/40  bone]
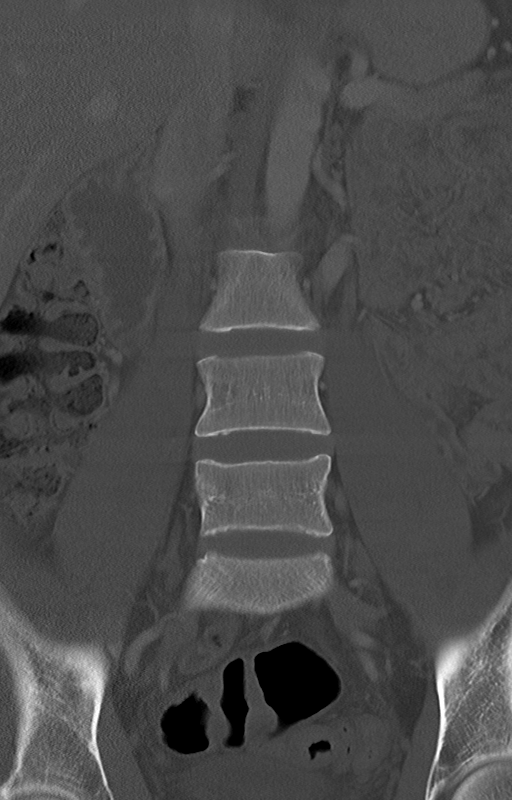
[im 24/40  bone]
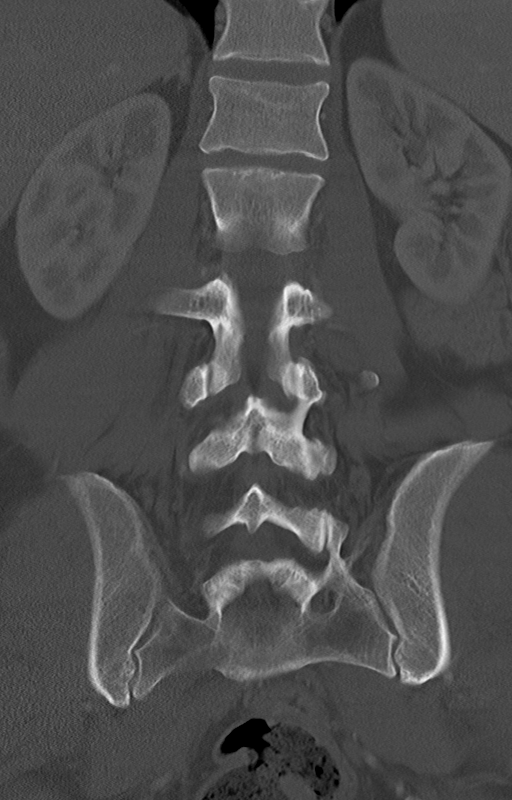

[Series 11: l spine axial thin · axial · 0.38mm/px · z∈[-742,-489]mm · 4 of 339 slices shown, 5 images]
[im 43/339  soft-tissue]
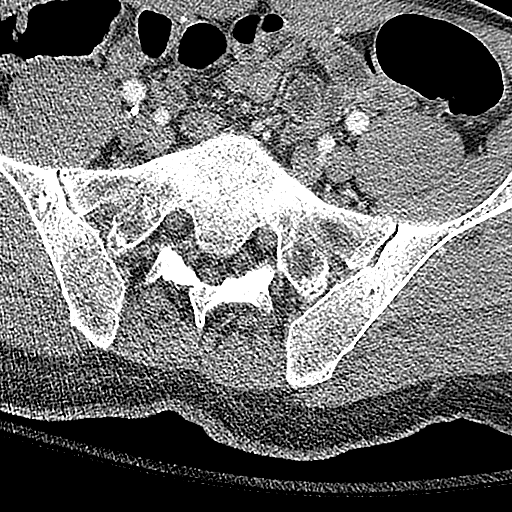
[im 43/339  bone]
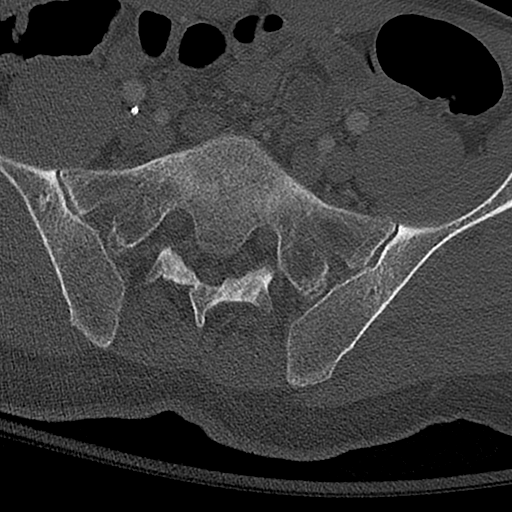
[im 127/339  bone]
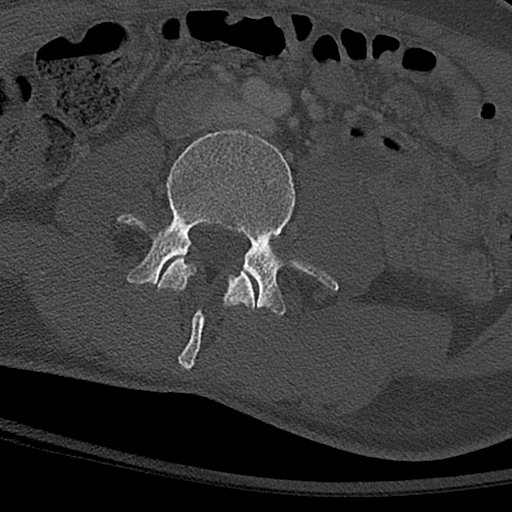
[im 212/339  bone]
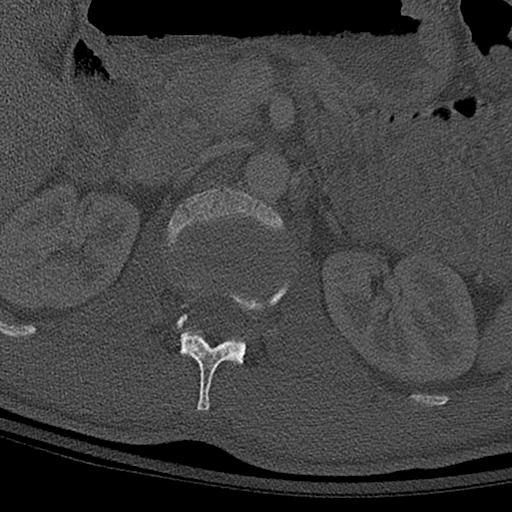
[im 296/339  bone]
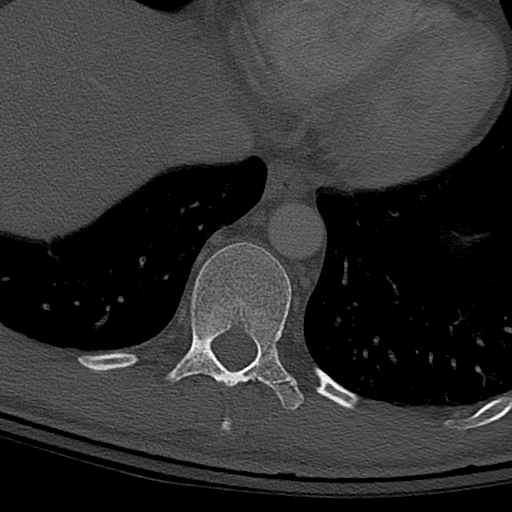

[Series 13: l spine sag · sagittal · 0.47mm/px · 5 of 107 slices shown, 6 images]
[im 36/107  bone]
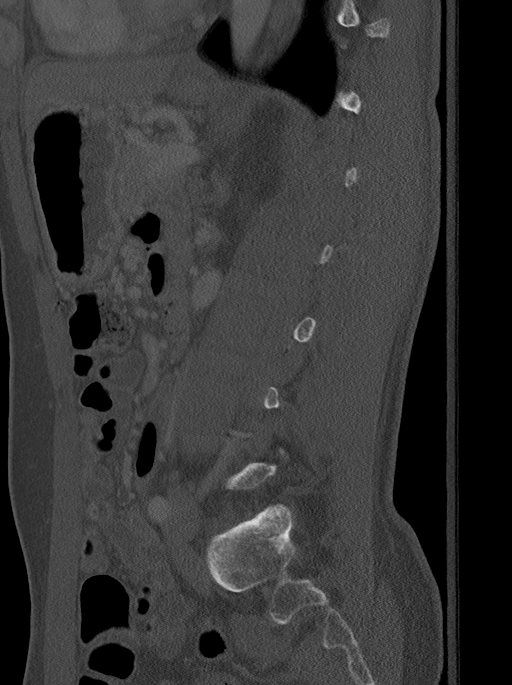
[im 45/107  bone]
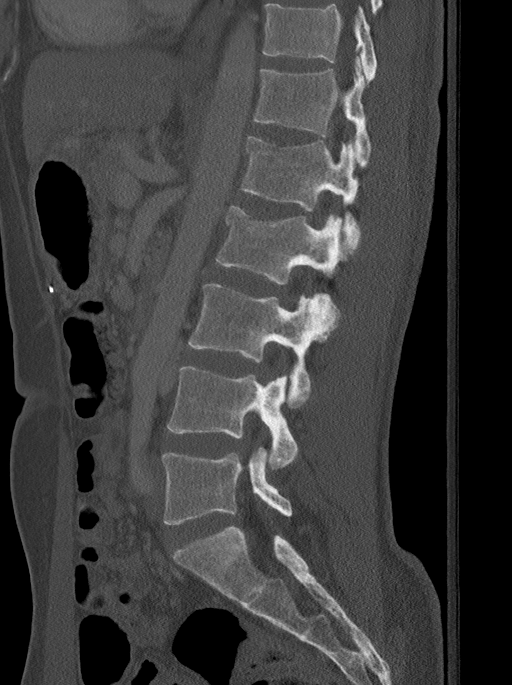
[im 54/107  soft-tissue]
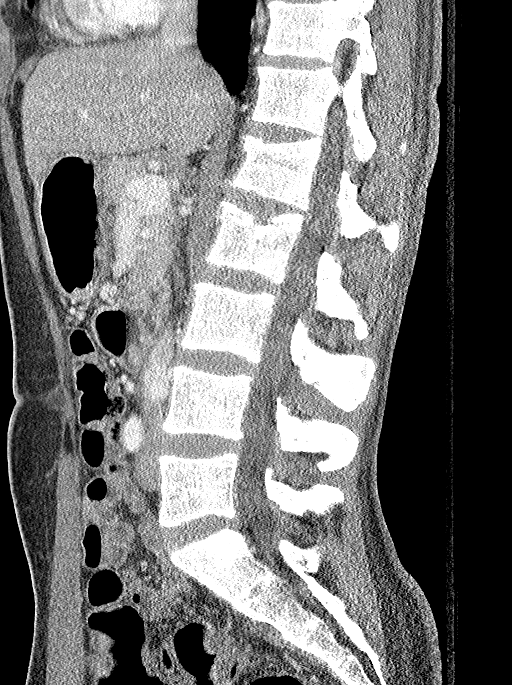
[im 54/107  bone]
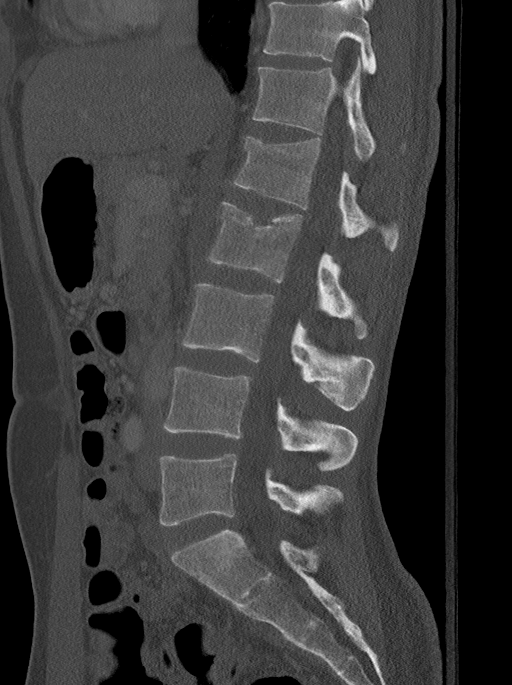
[im 62/107  bone]
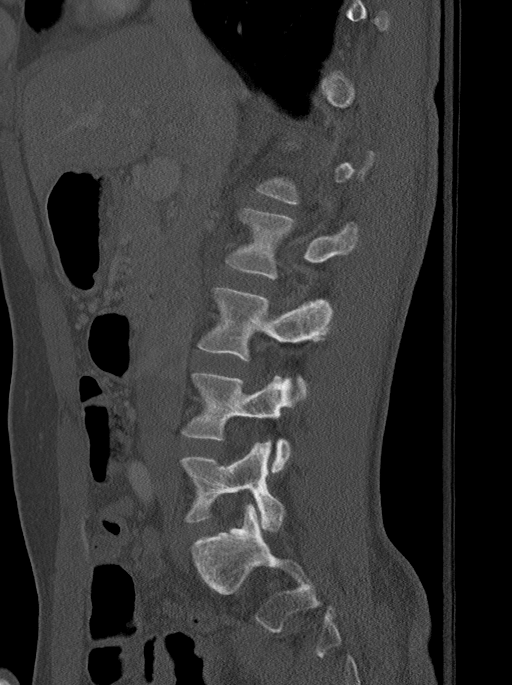
[im 71/107  bone]
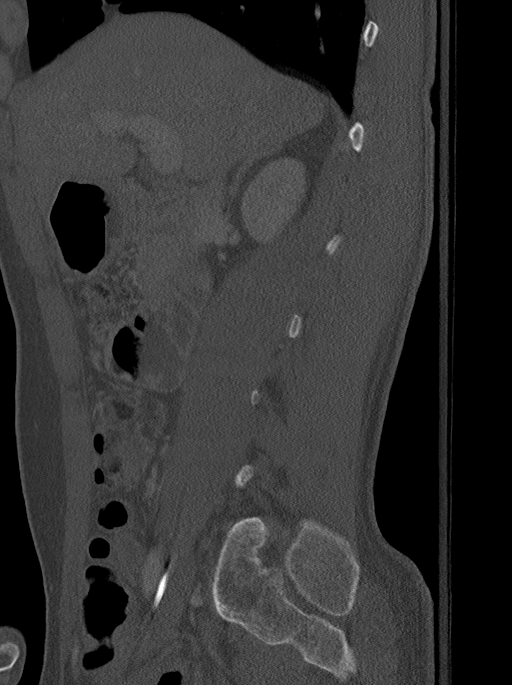

[12 of 33 positions shown; findings below may reference images not displayed]

Multidetector CT imaging of the thoracic and lumbar spine was
performed following the standard protocol during bolus
administration of intravenous contrast. Dedicated multiplanar
reformats were generated and reviewed.

RADIATION DOSE REDUCTION: This exam was performed according to the
departmental dose-optimization program which includes automated
exposure control, adjustment of the mA and/or kV according to
patient size and/or use of iterative reconstruction technique.

CONTRAST:  100mL OMNIPAQUE IOHEXOL 350 MG/ML SOLN
FINDINGS: CT CHEST FINDINGS

Cardiovascular: Incidental calcified aneurysm or chronic focal
dissection at the left aspect of the distal aortic arch, maximum
caliber of the vessel at this site 4.0 x 3.3 cm (series 4, image 46,
series 1, image 28). Normal heart size. No pericardial effusion.

Mediastinum/Nodes: No enlarged mediastinal, hilar, or axillary lymph
nodes. Thyroid gland, trachea, and esophagus demonstrate no
significant findings.

Lungs/Pleura: Mild paraseptal emphysema. No pleural effusion or
pneumothorax.

Musculoskeletal: No chest wall mass or suspicious osseous lesions
identified.

CT ABDOMEN PELVIS FINDINGS

Hepatobiliary: No solid liver abnormality is seen. No gallstones,
gallbladder wall thickening, or biliary dilatation.

Pancreas: Unremarkable. No pancreatic ductal dilatation or
surrounding inflammatory changes.

Spleen: Normal in size without significant abnormality.

Adrenals/Urinary Tract: Adrenal glands are unremarkable. Kidneys are
normal, without renal calculi, solid lesion, or hydronephrosis.
Bladder is unremarkable.

Stomach/Bowel: Stomach is within normal limits. Appendix appears
normal. No evidence of bowel wall thickening, distention, or
inflammatory changes.

Vascular/Lymphatic: Right femoral venous catheter. No enlarged
abdominal or pelvic lymph nodes.

Reproductive: No mass or other abnormality.

Other: Status post umbilical hernia repair.  No ascites.

Musculoskeletal: No acute osseous findings.

CT THORACIC AND LUMBAR SPINE FINDINGS

Alignment: Normal.

Vertebral bodies: Acute superior endplate wedge fractures of L1 and
L2 with less than 25% anterior height loss (series 5, image 56).

Disc spaces: Intact.

Soft tissues: Unremarkable.  No evident hematoma.
IMPRESSION: 1. Acute superior endplate wedge fractures of L1 and L2 with less
than 25% anterior height loss. No other fracture or dislocation
identified.
2. No evidence of acute traumatic injury to the organs of the chest,
abdomen, or pelvis.
3. Incidental note of a focal, saccular aneurysm or chronic focal
dissection at the left aspect of the distal aortic arch, maximum
caliber of the vessel at this site 4.0 x 3.3 cm. Although this does
not reflect acute traumatic vascular injury, recommend referral to
vascular specialist on a nonemergent, outpatient basis. This
recommendation follows [1N]
ACCF/AHA/AATS/ACR/ASA/SCA/SUFO/SUFO/SUFO/SUFO Guidelines for the
Diagnosis and Management of Patients With Thoracic Aortic Disease.
Circulation. [1N]; 121: [id number]. Aortic aneurysm NOS ([1N]-[1N])
4. Mild emphysema.

Acute findings were called by telephone at the time of
interpretation on [DATE] at [DATE] to Dr. SUFO, Who
verbally acknowledged these results.

Emphysema ([1N]-[1N]).

## 2021-03-19 MED ORDER — ONDANSETRON HCL 4 MG/2ML IJ SOLN
4.0000 mg | Freq: Four times a day (QID) | INTRAMUSCULAR | Status: DC | PRN
Start: 2021-03-19 — End: 2021-03-21

## 2021-03-19 MED ORDER — HYDROMORPHONE HCL 1 MG/ML IJ SOLN
1.0000 mg | Freq: Once | INTRAMUSCULAR | Status: AC
  Administered 2021-03-19: 1 mg via INTRAVENOUS
  Filled 2021-03-19: qty 1

## 2021-03-19 MED ORDER — LACTATED RINGERS IV SOLN: INTRAVENOUS | Status: DC

## 2021-03-19 MED ORDER — MORPHINE SULFATE (PF) 4 MG/ML IV SOLN
INTRAVENOUS | Status: AC
  Administered 2021-03-19: 4 mg
  Filled 2021-03-19: qty 1

## 2021-03-19 MED ORDER — ONDANSETRON 4 MG PO TBDP: 4.0000 mg | ORAL_TABLET | Freq: Four times a day (QID) | ORAL | Status: DC | PRN

## 2021-03-19 MED ORDER — DOCUSATE SODIUM 100 MG PO CAPS
100.0000 mg | ORAL_CAPSULE | Freq: Two times a day (BID) | ORAL | Status: DC
  Administered 2021-03-19 – 2021-03-21 (×4): 100 mg via ORAL
  Filled 2021-03-19 (×4): qty 1

## 2021-03-19 MED ORDER — MORPHINE SULFATE (PF) 4 MG/ML IV SOLN: 4.0000 mg | INTRAVENOUS | Status: DC | PRN

## 2021-03-19 MED ORDER — MORPHINE SULFATE (PF) 4 MG/ML IV SOLN
4.0000 mg | Freq: Once | INTRAVENOUS | Status: AC
  Filled 2021-03-19: qty 1

## 2021-03-19 MED ORDER — IOHEXOL 350 MG/ML SOLN
100.0000 mL | Freq: Once | INTRAVENOUS | Status: AC | PRN
  Administered 2021-03-19: 100 mL via INTRAVENOUS

## 2021-03-19 MED ORDER — ENOXAPARIN SODIUM 30 MG/0.3ML IJ SOSY
30.0000 mg | PREFILLED_SYRINGE | Freq: Two times a day (BID) | INTRAMUSCULAR | Status: DC
  Administered 2021-03-20 – 2021-03-21 (×3): 30 mg via SUBCUTANEOUS
  Filled 2021-03-19 (×3): qty 0.3

## 2021-03-19 MED ORDER — ACETAMINOPHEN 500 MG PO TABS
1000.0000 mg | ORAL_TABLET | Freq: Four times a day (QID) | ORAL | Status: DC
  Administered 2021-03-19 – 2021-03-21 (×6): 1000 mg via ORAL
  Filled 2021-03-19 (×7): qty 2

## 2021-03-19 MED ORDER — METHOCARBAMOL 500 MG PO TABS
1000.0000 mg | ORAL_TABLET | Freq: Three times a day (TID) | ORAL | Status: DC
  Administered 2021-03-19 – 2021-03-21 (×6): 1000 mg via ORAL
  Filled 2021-03-19 (×6): qty 2

## 2021-03-19 MED ORDER — OXYCODONE HCL 5 MG PO TABS
5.0000 mg | ORAL_TABLET | ORAL | Status: DC | PRN
  Administered 2021-03-19 – 2021-03-21 (×6): 10 mg via ORAL
  Administered 2021-03-21: 5 mg via ORAL
  Filled 2021-03-19 (×4): qty 2
  Filled 2021-03-19: qty 1
  Filled 2021-03-19 (×2): qty 2

## 2021-03-19 MED ORDER — KETOROLAC TROMETHAMINE 15 MG/ML IJ SOLN
30.0000 mg | Freq: Four times a day (QID) | INTRAMUSCULAR | Status: DC
  Administered 2021-03-19 – 2021-03-21 (×7): 30 mg via INTRAVENOUS
  Filled 2021-03-19 (×7): qty 2
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
## 2021-03-19 NOTE — ED Notes (Signed)
Received verbal report from Chris C RN at this time ?

## 2021-03-19 NOTE — ED Notes (Signed)
Pt still in mri 

## 2021-03-19 NOTE — ED Notes (Signed)
Pt to ct 
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
## 2021-03-19 NOTE — Procedures (Signed)
° °  Procedure Note  Date: 03/19/2021  Procedure: central venous catheter placement--right, femoral vein, without ultrasound guidance  Pre-op diagnosis: inadequate IV access, unable to obtain additional peripheral access Post-op diagnosis: same  Surgeon: Jesusita Oka, MD  Anesthesia: local  EBL: <5cc Drains/Implants: 20 cm, triple lumen central venous catheter  Description of procedure: Time-out was performed verifying correct patient, procedure, site, laterality, and signature of informed consent. The right groin was prepped and draped in the usual sterile fashion. The right femoral vein was accessed using an introducer needle and a guidewire passed through the needle. The needle was removed and a skin nick was made. The tract was dilated and the central venous catheter advanced over the guidewire followed by removal of the guidewire. All ports drew blood easily and all were flushed with saline. The catheter was secured to the skin with suture and a sterile dressing. The patient tolerated the procedure well. There were no immediate complications.   Jesusita Oka, MD General and [facility]

## 2021-03-19 NOTE — Discharge Instructions (Signed)
Incidental findings on your CT scan of your abdomen: Incidental note of a focal, saccular aneurysm or chronic focal dissection at the left aspect of the distal aortic arch, maximum caliber of the vessel at this site 4.0 x 3.3 cm. Although this does not reflect acute traumatic vascular injury, recommend referral to vascular specialist on a nonemergent, outpatient basis.

## 2021-03-19 NOTE — ED Notes (Signed)
Sitter at bedside, Patent examiner at bedside

## 2021-03-19 NOTE — Consult Note (Signed)
Neurosurgery Consultation  Reason for Consult: Quadriplegia p fall Referring Physician: Bobbye Morton  CC: Weakness  HPI: This is a 46 y.o. man that presents after a fall from height, reported as ~31ft. Reported as an SI due to hearing the news of his father being in the ICU. Since that time, he reports numbness from the neck down with quadriplegia. No h/o prior events like this, per his report. No hypotension or bradycardia that I see charted, maintaining his airway well, does endorse severe neck pain, endorses low back pain but reports the neck is worse.   ROS: A 14 point ROS was performed and is negative except as noted in the HPI.   PMHx: No past medical history on file. FamHx: No family history on file. SocHx:  has no history on file for tobacco use, alcohol use, and drug use.  Exam: Vital signs in last 24 hours: Temp:  [97.2 F (36.2 C)] 97.2 F (36.2 C) (02/18 1543) Pulse Rate:  [75-93] 89 (02/18 1616) Resp:  [13-18] 18 (02/18 1616) BP: (131-153)/(88-142) 137/92 (02/18 1616) SpO2:  [96 %-100 %] 96 % (02/18 1616) Weight:  [79.4 kg] 79.4 kg (02/18 1552) General: Awake, alert, cooperative, lying in bed in NAD Head: Normocephalic and atruamatic HEENT: In rigid cervical collar Pulmonary: breathing room air comfortably, no evidence of increased work of breathing Cardiac: RRR Abdomen: S NT ND Extremities: Warm and well perfused x4, diffuse tattoos Neuro: AOx3, PERRL, EOMI, FS & SS, TM No movement to command but was able to get some bicep activation on the right greater than left, sensory level around the clavicles bilaterally and numb distally, no response to painful stimulus but with nailbed pressure on the right foot, he did have some wiggling of the left sided toes, but no withdrawal of the right toes. Tone preserved with reflexes 2+, no hoffman's, no clonus. No activation of the trapezius bilaterally but good SCM function.  Assessment and Plan: 46 y.o. man s/p fall from height. CT  C/T/L spine personally reviewed, which shows L1 and L2 compression fractures, no retropulsion. I agree w/ MRI to evaluate for an underlying cord lesion. 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 Lots of exam features consistent with a non-structural cause.  -no acute neurosurgical intervention indicated at this time -will f/u MRIs, keep in spine precautions until those studies are completed -please call with any concerns or questions  Judith Part, MD 03/19/21 5:15 PM [facility]

## 2021-03-19 NOTE — ED Notes (Signed)
Pt returned from ct

## 2021-03-19 NOTE — Progress Notes (Signed)
Orthopedic Tech Progress Note Patient Details:  Kerry Scott [birth year] [id number]  Level 1 trauma  Patient ID: [name], male   DOB: 02-Oct-1975, 46 y.o.   MRN: [id number]  Docia Furl 03/19/2021, 4:09 PM
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
## 2021-03-19 NOTE — ED Notes (Signed)
Portable chest pelvis
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
## 2021-03-19 NOTE — ED Notes (Signed)
Nss at approx 145ml/hr lt upper arm

## 2021-03-19 NOTE — ED Notes (Signed)
Mri has called

## 2021-03-19 NOTE — ED Provider Notes (Incomplete)
°  [facility] EMERGENCY DEPARTMENT Provider Note   CSN: [id number] Arrival date & time: 03/19/21  1522     History {Add pertinent medical, surgical, social history, OB history to HPI:1} No chief complaint on file.   Dantae Meunier is a 46 y.o. male.  HPI     Home Medications Prior to Admission medications   Not on File      Allergies    Penicillins    Review of Systems   Review of Systems  Physical Exam Updated Vital Signs Temp (!) 97.2 F (36.2 C)  Physical Exam  ED Results / Procedures / Treatments   Labs (all labs ordered are listed, but only abnormal results are displayed) Labs Reviewed - No data to display  EKG None  Radiology No results found.  Procedures Procedures  {Document cardiac monitor, telemetry assessment procedure when appropriate:1}  Medications Ordered in ED Medications - No data to display  ED Course/ Medical Decision Making/ A&P                           Medical Decision Making  ***  {Document critical care time when appropriate:1} {Document review of labs and clinical decision tools ie heart score, Chads2Vasc2 etc:1}  {Document your independent review of radiology images, and any outside records:1} {Document your discussion with family members, caretakers, and with consultants:1} {Document social determinants of health affecting pt's care:1} {Document your decision making why or why not admission, treatments were needed:1} Final Clinical Impression(s) / ED Diagnoses Final diagnoses:  None    Rx / DC Orders ED Discharge Orders     None

## 2021-03-19 NOTE — ED Provider Notes (Signed)
[facility] EMERGENCY DEPARTMENT Provider Note  History   Chief Complaint  Patient presents with   Trauma   The history is provided by the patient, the EMS personnel and the police.  Illness Location:  Neck pain Quality:  Unable to characterize Severity:  Unable to specify Onset quality:  Sudden Duration: Immediately PTA s/p fall. Timing:  Constant Progression:  Unchanged Chronicity:  New Context:  See MDM Associated symptoms: no abdominal pain, no chest pain, no cough, no ear pain, no fever, no rash, no shortness of breath, no sore throat and no vomiting    No past medical history on file.      No family history on file.  Review of Systems  Constitutional:  Negative for chills and fever.  HENT:  Negative for ear pain and sore throat.   Eyes:  Negative for pain and visual disturbance.  Respiratory:  Negative for cough and shortness of breath.   Cardiovascular:  Negative for chest pain and palpitations.  Gastrointestinal:  Negative for abdominal pain and vomiting.  Endocrine: Negative.   Genitourinary:  Negative for dysuria and hematuria.  Musculoskeletal:  Negative for arthralgias and back pain.  Skin:  Negative for color change and rash.  Allergic/Immunologic: Negative.   Neurological:  Negative for seizures and syncope.  Hematological: Negative.   Psychiatric/Behavioral: Negative.    All other systems reviewed and are negative.   Physical Exam   Today's Vitals   03/19/21 2245 03/19/21 2252 03/19/21 2300 03/19/21 2315  BP: 111/65  121/83 127/86  Pulse: 91  86 87  Resp: (!) 21  (!) 0 19  Temp:      TempSrc:      SpO2: 97%  98% 99%  Weight:      Height:      PainSc:  10-Worst pain ever      Physical Exam:  General: Euvolemic, anxious appearing   Head: Normocephalic, atraumatic.  Obvious nose deformity, patient reports is chronic, not tender No skull depressions or lacerations.  No conjunctival hemorrhage No periorbital ecchymoses,  Racoon Eyes, or Battle Sign bilaterally Ears atraumatic No nasal septal deviation or hematoma  PERRL, EOMI, sclera anicteric. Mucus membranes moist.    Neck: Supple, trachea midline TTP over midline cervical spine, no step offs or deformities.  Cervical hard collar in place   Cardiovascular: RATE: 93 RHYTHM: regular 2+ radial, femoral, DP pulses bilaterally   Respiratory/Chest Wall: Lungs clear to auscultation bilaterally Clavicles stable to compression Chest stable to AP and Lateral Compression, no appreciable crepitus   Extremities: Warm, well perfused. No gross deformities.    Gastrointestinal: Abdomen soft, patient not able to appreciate if tender FAST performed: No   Neurologic: Cranial nerves intact, no extremity motor movement, denies extremity sensation, intact tone in bilateral upper extremities   Genitourinary: Normal male   Skin: Unremarkable   Glasgow Coma Scale: Spontaneous eye opening, oriented, no motor response to pain.  GCS 10   Rectal: No rectal tone, no gross blood   Spine: No TTP along midline T/L spine, no step offs or deformities    Other:      ED Course  Procedures  Medical Decision Making:  Kerry Scott is a 46 y.o. male w/ h/o anxiety, seizures who p/w EMS as a L1 trauma s/p fall.   Prior to arrival of the patient, the room was prepared with the following: code cart to bedside, video intubation scope, suction, BVM. Trauma team was present prior to arrival of the patient.  History is obtained from EMS as well as the patient. Briefly, the patient resides at [facility], after receiving the news about his father he jumped 15 feet onto a concrete floor landing on his back. The patient was transported to [facility] ED for continued care and further management.   Upon arrival, the patient was transferred over to the trauma bed. ABCs intact as exam above. Once 2 IVs were confirmed, the secondary exam was performed, and findings are noted  above. Pertinent physical exam findings include no extremity motor movement, denies extremity sensation, intact on bilateral upper extremities, no rectal tone. Portable XRs performed at the bedside. eFAST exam not performed. The patient was then prepared and sent to the CT for full trauma scans.   Currently, patient is awake, alert, and protecting own airway and is hemodynamically stable.  Note, patient states he takes Klonopin for anxiety/seizures.  He states his last seizure was 3 months ago.  He endorses compliance with his medication.  ER provider interpretation of Imaging / Radiology:  - CXR:  No evidence of pneumothorax or tracheal deviation - PXR:  No evidence of acute hip fracture or malignment - CT H: No acute intracranial abnormality - CT C-spine: Mildly motion degraded, with this limitation there were no fractures or subluxations appreciable - CT CAP: No acute traumatic injury to the organs of the chest, abdomen, or pelvis.  - CT T/L-spine: L1-L2 acute SEP wedge fracture.  ER provider interpretation of EKG:  Not indicated  ER provider interpretation of Labs:  CBC: No leukocytosis, no acute anemia CMP: No significant electrolyte abnormality, BUN 13,Cr 1.33 (no comparative labs) Coags: Unremarkable LA: 1.5 Ethanol: <10 UA: Pending UDS: Pending Covid/Flu: Negative  Key medications administered in the ER:  Medications  lactated ringers infusion ( Intravenous New Bag/Given 03/19/21 2253)  acetaminophen (TYLENOL) tablet 1,000 mg (1,000 mg Oral Given 03/19/21 2251)  oxyCODONE (Oxy IR/ROXICODONE) immediate release tablet 5-10 mg (10 mg Oral Given 03/19/21 2251)  morphine (PF) 4 MG/ML injection 4 mg (has no administration in time range)  docusate sodium (COLACE) capsule 100 mg (100 mg Oral Given 03/19/21 2251)  ondansetron (ZOFRAN-ODT) disintegrating tablet 4 mg (has no administration in time range)    Or  ondansetron (ZOFRAN) injection 4 mg (has no administration in time range)   enoxaparin (LOVENOX) injection 30 mg (has no administration in time range)  ketorolac (TORADOL) 15 MG/ML injection 30 mg (30 mg Intravenous Given 03/19/21 2338)  methocarbamol (ROBAXIN) tablet 1,000 mg (1,000 mg Oral Given 03/19/21 2251)  iohexol (OMNIPAQUE) 350 MG/ML injection 100 mL (100 mLs Intravenous Contrast Given 03/19/21 1557)  morphine (PF) 4 MG/ML injection 4 mg (4 mg Intravenous Given 03/19/21 1645)  HYDROmorphone (DILAUDID) injection 1 mg (1 mg Intravenous Given 03/19/21 2156)   Diagnoses considered: Trauma scans unremarkable for L1-L2 SEP wedge fracture, MR pending.  Neurosurgery has seen the patient and thinks that this is most consistent with conversion disorder.  Consulted: Trauma, Neurosurgery  Dispo: Admit for paraplegia, MRIs per neurosurgery  Patient seen in conjunction with Dr. Barbette Reichmann medical dictation software was used in the creation of this note.   Electronically signed by: Wynetta Fines, MD on 03/19/2021 at 11:45 PM  Clinical Impression:  1. Trauma   2. Encounter for imaging to screen for metal prior to magnetic resonance imaging (MRI)     Dispo: Imagene Riches, MD 03/19/21 2352    Wyvonnia Dusky, MD 03/20/21 1022

## 2021-03-19 NOTE — ED Notes (Signed)
Bp 178/72 r86

## 2021-03-19 NOTE — Progress Notes (Addendum)
Trauma Event Note   TRN rounded on patient. Reports improved sensation to right lower extremity, reports no sensation to left lower extremity. CMS intact. CVC right groin. Police at bedside. CAGE AID completed. Requesting food, NPO. Consulted Dr. Derrell Lolling for diet orders - to remain NPO. Primary RN Bonita Quin made aware. Pt stable at this time, VS WDL.   Last imported Vital Signs BP (!) 148/84    Pulse 97    Temp 99.3 F (37.4 C) (Oral)    Resp 15    Ht 6' (1.829 m)    Wt 175 lb 0.7 oz (79.4 kg)    SpO2 95%    BMI 23.74 kg/m   Trending CBC Recent Labs    03/19/21 1536 03/19/21 1544  WBC 9.9  --   HGB 15.1 15.0  HCT 46.1 44.0  PLT 273  --     Trending Coag's Recent Labs    03/19/21 1536  INR 1.2    Trending BMET Recent Labs    03/19/21 1536 03/19/21 1544  NA 141 144  K 3.9 3.9  CL 107 108  CO2 24  --   BUN 13 13  CREATININE 1.33* 1.20  GLUCOSE 101* 97      Guila Owensby O Sravya Grissom  Trauma Response RN  Please call TRN at [phone] for further assistance.

## 2021-03-20 ENCOUNTER — Encounter (HOSPITAL_COMMUNITY): Payer: Self-pay

## 2021-03-20 LAB — CBC
HCT: 37.4 % — ABNORMAL LOW (ref 39.0–52.0)
Hemoglobin: 12.3 g/dL — ABNORMAL LOW (ref 13.0–17.0)
MCH: 26.5 pg (ref 26.0–34.0)
MCHC: 32.9 g/dL (ref 30.0–36.0)
MCV: 80.4 fL (ref 80.0–100.0)
Platelets: 187 10*3/uL (ref 150–400)
RBC: 4.65 MIL/uL (ref 4.22–5.81)
RDW: 16.4 % — ABNORMAL HIGH (ref 11.5–15.5)
WBC: 10.4 10*3/uL (ref 4.0–10.5)
nRBC: 0 % (ref 0.0–0.2)

## 2021-03-20 LAB — BASIC METABOLIC PANEL
Anion gap: 8 (ref 5–15)
BUN: 13 mg/dL (ref 6–20)
CO2: 25 mmol/L (ref 22–32)
Calcium: 8.2 mg/dL — ABNORMAL LOW (ref 8.9–10.3)
Chloride: 109 mmol/L (ref 98–111)
Creatinine, Ser: 1.4 mg/dL — ABNORMAL HIGH (ref 0.61–1.24)
GFR, Estimated: >60 mL/min (ref >60)
Glucose, Bld: 87 mg/dL (ref 70–99)
Potassium: 3.5 mmol/L (ref 3.5–5.1)
Sodium: 142 mmol/L (ref 135–145)

## 2021-03-20 LAB — SAMPLE TO BLOOD BANK

## 2021-03-20 MED ORDER — THIAMINE HCL 100 MG PO TABS
100.0000 mg | ORAL_TABLET | Freq: Every day | ORAL | Status: DC
  Administered 2021-03-20 – 2021-03-21 (×2): 100 mg via ORAL
  Filled 2021-03-20 (×2): qty 1

## 2021-03-20 MED ORDER — FOLIC ACID 1 MG PO TABS
1.0000 mg | ORAL_TABLET | Freq: Every day | ORAL | Status: DC
  Administered 2021-03-20 – 2021-03-21 (×2): 1 mg via ORAL
  Filled 2021-03-20 (×2): qty 1

## 2021-03-20 MED ORDER — THIAMINE HCL 100 MG/ML IJ SOLN
100.0000 mg | Freq: Every day | INTRAMUSCULAR | Status: DC
  Filled 2021-03-20: qty 2

## 2021-03-20 MED ORDER — ADULT MULTIVITAMIN W/MINERALS CH
1.0000 | ORAL_TABLET | Freq: Every day | ORAL | Status: DC
  Administered 2021-03-20 – 2021-03-21 (×2): 1 via ORAL
  Filled 2021-03-20 (×2): qty 1

## 2021-03-20 MED ORDER — LORAZEPAM 1 MG PO TABS: 1.0000 mg | ORAL_TABLET | ORAL | Status: DC | PRN

## 2021-03-20 MED ORDER — SODIUM CHLORIDE 0.9% FLUSH
10.0000 mL | Freq: Two times a day (BID) | INTRAVENOUS | Status: DC
  Administered 2021-03-21: 10 mL

## 2021-03-20 MED ORDER — SODIUM CHLORIDE 0.9% FLUSH: 10.0000 mL | INTRAVENOUS | Status: DC | PRN

## 2021-03-20 MED ORDER — NICOTINE 7 MG/24HR TD PT24
7.0000 mg | MEDICATED_PATCH | Freq: Every day | TRANSDERMAL | Status: DC
  Administered 2021-03-20 – 2021-03-21 (×2): 7 mg via TRANSDERMAL
  Filled 2021-03-20 (×2): qty 1

## 2021-03-20 MED ORDER — LORAZEPAM 2 MG/ML IJ SOLN: 1.0000 mg | INTRAMUSCULAR | Status: DC | PRN

## 2021-03-20 MED ORDER — CHLORHEXIDINE GLUCONATE CLOTH 2 % EX PADS
6.0000 | MEDICATED_PAD | Freq: Every day | CUTANEOUS | Status: DC
  Administered 2021-03-20: 6 via TOPICAL

## 2021-03-20 NOTE — Progress Notes (Signed)
Orthopedic Tech Progress Note Patient Details:  Markie Sopp [birth year] [id number]  TLSO at bedside for use when OOB with PT.  Ortho Devices Type of Ortho Device: Thoracolumbar corset (TLSO) Ortho Device/Splint Location: at bedside Ortho Device/Splint Interventions: Ordered, Adjustment   Post Interventions Patient Tolerated: Well Instructions Provided: Care of device, Adjustment of device  Rian Busche Jeri Modena 03/20/2021, 2:55 PM

## 2021-03-20 NOTE — Progress Notes (Signed)
Subjective: States he can't move his LLE.  Has some pain in his back.  Otherwise hungry with no other complaints.  Voiding well.  In custody  ROS: See above, otherwise other systems negative  Objective: Vital signs in last 24 hours: Temp:  [97.2 F (36.2 C)-99.3 F (37.4 C)] 98.9 F (37.2 C) (02/19 0354) Pulse Rate:  [75-97] 76 (02/19 0700) Resp:  [0-24] 22 (02/19 0930) BP: (108-153)/(60-142) 122/74 (02/19 0930) SpO2:  [62 %-100 %] 97 % (02/19 0745) Weight:  [79.4 kg] 79.4 kg (02/18 2201)    Intake/Output from previous day: 02/18 0701 - 02/19 0700 In: 1000 [I.V.:1000] Out: -  Intake/Output this shift: Total I/O In: -  Out: 400 [Urine:400]  PE: Gen: NAD, laying in bed HEENT: PERRL, c-collar removed Heart: regular Lungs: CTAB Abd: soft, NT, ND GU: voiding spontaneously EXT: R femoral cath in place.  Moves all extremities except LLE, no edema Neuro: normal neuro exam except LLE.  No sensation to soft touch and no apparent sensation to sharp prick for the entirety of his LLE.  Has sensation in his scrotum.  Some muscle firing of the quad when he tried to raise his leg, but minimal.  Guard states he thought he saw him move his leg earlier, unclear though Psych: oriented.  States he doesn't have plans for further harm.  " I just lost it when I heard my dad was in the ICU yesterday"   Lab Results:  Recent Labs    03/19/21 1536 03/19/21 1544 03/20/21 0601  WBC 9.9  --  10.4  HGB 15.1 15.0 12.3*  HCT 46.1 44.0 37.4*  PLT 273  --  187   BMET Recent Labs    03/19/21 1536 03/19/21 1544 03/20/21 0601  NA 141 144 142  K 3.9 3.9 3.5  CL 107 108 109  CO2 24  --  25  GLUCOSE 101* 97 87  BUN 13 13 13   CREATININE 1.33* 1.20 1.40*  CALCIUM 9.2  --  8.2*   PT/INR Recent Labs    03/19/21 1536  LABPROT 14.8  INR 1.2   CMP     Component Value Date/Time   NA 142 03/20/2021 0601   K 3.5 03/20/2021 0601   CL 109 03/20/2021 0601   CO2 25 03/20/2021 0601    GLUCOSE 87 03/20/2021 0601   BUN 13 03/20/2021 0601   CREATININE 1.40 (H) 03/20/2021 0601   CALCIUM 8.2 (L) 03/20/2021 0601   PROT 7.6 03/19/2021 1536   ALBUMIN 4.0 03/19/2021 1536   AST 31 03/19/2021 1536   ALT 23 03/19/2021 1536   ALKPHOS 87 03/19/2021 1536   BILITOT 0.5 03/19/2021 1536   GFRNONAA >60 03/20/2021 0601   Lipase  No results found for: LIPASE     Studies/Results: CT HEAD WO CONTRAST (5MM)  Result Date: 03/19/2021 CLINICAL DATA:  Head and neck trauma, fall EXAM: CT HEAD WITHOUT CONTRAST CT CERVICAL SPINE WITHOUT CONTRAST TECHNIQUE: Multidetector CT imaging of the head and cervical spine was performed following the standard protocol without intravenous contrast. Multiplanar CT image reconstructions of the cervical spine were also generated. RADIATION DOSE REDUCTION: This exam was performed according to the departmental dose-optimization program which includes automated exposure control, adjustment of the mA and/or kV according to patient size and/or use of iterative reconstruction technique. COMPARISON:  None. FINDINGS: CT HEAD FINDINGS Brain: No evidence of acute infarction, hemorrhage, hydrocephalus, extra-axial collection or mass lesion/mass effect. Vascular: No hyperdense vessel or unexpected  calcification. Skull: Normal. Negative for fracture or focal lesion. Sinuses/Orbits: No acute finding. Other: None. CT CERVICAL SPINE FINDINGS Examination of the cervical spine is mildly limited by motion artifact throughout. Alignment: Normal. Skull base and vertebrae: No acute fracture. No primary bone lesion or focal pathologic process. Soft tissues and spinal canal: No prevertebral fluid or swelling. No visible canal hematoma. Disc levels: Focally mild disc space height loss and osteophytosis of C6-C7. Disc spaces are otherwise preserved. Upper chest: Negative. Other: None. IMPRESSION: 1. No acute intracranial pathology. 2. Examination of the cervical spine is mildly limited by motion  artifact throughout. Within this limitation, no fracture or static subluxation of the cervical spine. These results were called by telephone at the time of interpretation on 03/19/2021 at 4:25 pm to Dr. Kris Mouton, who verbally acknowledged these results. Electronically Signed   By: Jearld Lesch M.D.   On: 03/19/2021 16:26   CT Cervical Spine Wo Contrast  Result Date: 03/19/2021 CLINICAL DATA:  Head and neck trauma, fall EXAM: CT HEAD WITHOUT CONTRAST CT CERVICAL SPINE WITHOUT CONTRAST TECHNIQUE: Multidetector CT imaging of the head and cervical spine was performed following the standard protocol without intravenous contrast. Multiplanar CT image reconstructions of the cervical spine were also generated. RADIATION DOSE REDUCTION: This exam was performed according to the departmental dose-optimization program which includes automated exposure control, adjustment of the mA and/or kV according to patient size and/or use of iterative reconstruction technique. COMPARISON:  None. FINDINGS: CT HEAD FINDINGS Brain: No evidence of acute infarction, hemorrhage, hydrocephalus, extra-axial collection or mass lesion/mass effect. Vascular: No hyperdense vessel or unexpected calcification. Skull: Normal. Negative for fracture or focal lesion. Sinuses/Orbits: No acute finding. Other: None. CT CERVICAL SPINE FINDINGS Examination of the cervical spine is mildly limited by motion artifact throughout. Alignment: Normal. Skull base and vertebrae: No acute fracture. No primary bone lesion or focal pathologic process. Soft tissues and spinal canal: No prevertebral fluid or swelling. No visible canal hematoma. Disc levels: Focally mild disc space height loss and osteophytosis of C6-C7. Disc spaces are otherwise preserved. Upper chest: Negative. Other: None. IMPRESSION: 1. No acute intracranial pathology. 2. Examination of the cervical spine is mildly limited by motion artifact throughout. Within this limitation, no fracture or  static subluxation of the cervical spine. These results were called by telephone at the time of interpretation on 03/19/2021 at 4:25 pm to Dr. Kris Mouton, who verbally acknowledged these results. Electronically Signed   By: Jearld Lesch M.D.   On: 03/19/2021 16:26   MR CERVICAL SPINE WO CONTRAST  Result Date: 03/19/2021 CLINICAL DATA:  Initial evaluation for acute trauma, fall. EXAM: MRI CERVICAL, THORACIC AND LUMBAR SPINE WITHOUT CONTRAST TECHNIQUE: Multiplanar and multiecho pulse sequences of the cervical spine, to include the craniocervical junction and cervicothoracic junction, and thoracic and lumbar spine, were obtained without intravenous contrast. COMPARISON:  Prior CT from earlier the same day. FINDINGS: MRI CERVICAL SPINE FINDINGS Alignment: Physiologic with preservation of the normal cervical lordosis. No listhesis. Vertebrae: Vertebral body height maintained without acute or chronic fracture. Bone marrow signal intensity diffusely decreased on T1 weighted sequence, nonspecific, but most commonly related to anemia, smoking or obesity. No worrisome osseous lesions or abnormal marrow edema. Cord: Normal signal and morphology. No evidence for traumatic cord injury. Ligamentous structures intact. Posterior Fossa, vertebral arteries, paraspinal tissues: Unremarkable. Disc levels: C5-6: Mild disc bulge. No significant spinal stenosis. Foramina remain patent. C6-7: Mild disc bulge with bilateral uncovertebral hypertrophy. No significant spinal stenosis. Moderate bilateral C7 foraminal  stenosis. C7-T1: Negative interspace. Mild right-sided facet hypertrophy. No stenosis. MRI THORACIC SPINE FINDINGS Alignment:  Physiologic.  No listhesis. Vertebrae: Mild chronic compression deformities with height loss seen at the superior endplates of T1 through T5. No associated bony retropulsion. Otherwise, vertebral body height maintained. No acute fracture. Bone marrow signal intensity diffusely decreased on T1  weighted sequence, nonspecific, but most commonly related to anemia, smoking or obesity. No discrete or worrisome osseous lesions. No abnormal marrow edema. Cord: Normal signal and morphology. Ligamentous structures are intact. Paraspinal and other soft tissues: Unremarkable. Disc levels: Unremarkable. No significant disc pathology. No stenosis or impingement. MRI LUMBAR SPINE FINDINGS Segmentation: Standard. Lowest well-formed disc space labeled the L5-S1 level. Alignment: Physiologic with preservation of the normal lumbar lordosis. No listhesis. Vertebrae: Acute compression fractures involving the superior endplates of L1 and L2 again seen. Associated mild 20-25% height loss without bony retropulsion. Vertebral body height otherwise maintained. Bone marrow signal intensity diffusely decreased on T1 weighted sequence, nonspecific, but most commonly related to anemia, smoking or obesity. No discrete or worrisome osseous lesions. No other abnormal marrow edema. Conus medullaris and cauda equina: Conus extends to the T12-L1 level. Conus and cauda equina appear normal. Paraspinal and other soft tissues: Mild paraspinous edema adjacent to the acute L1 and L2 compression fractures. Visualized paraspinous soft tissues otherwise unremarkable. Visualized visceral structures within normal limits. Disc levels: No significant disc pathology seen within the lumbar spine. No disc bulge or focal disc herniation. Minimal lower lumbar facet hypertrophy. No stenosis or impingement. IMPRESSION: 1. Acute compression fractures involving the superior endplates of L1 and L2 with up to 20-25% height loss without bony retropulsion. 2. No other acute traumatic injury within the cervical, thoracic, or lumbar spine. 3. Mild degenerative spondylosis at C5-6 and C6-7 without significant spinal stenosis. Associated moderate bilateral C7 foraminal narrowing. Electronically Signed   By: Rise MuBenjamin  McClintock M.D.   On: 03/19/2021 21:42   MR  THORACIC SPINE WO CONTRAST  Result Date: 03/19/2021 CLINICAL DATA:  Initial evaluation for acute trauma, fall. EXAM: MRI CERVICAL, THORACIC AND LUMBAR SPINE WITHOUT CONTRAST TECHNIQUE: Multiplanar and multiecho pulse sequences of the cervical spine, to include the craniocervical junction and cervicothoracic junction, and thoracic and lumbar spine, were obtained without intravenous contrast. COMPARISON:  Prior CT from earlier the same day. FINDINGS: MRI CERVICAL SPINE FINDINGS Alignment: Physiologic with preservation of the normal cervical lordosis. No listhesis. Vertebrae: Vertebral body height maintained without acute or chronic fracture. Bone marrow signal intensity diffusely decreased on T1 weighted sequence, nonspecific, but most commonly related to anemia, smoking or obesity. No worrisome osseous lesions or abnormal marrow edema. Cord: Normal signal and morphology. No evidence for traumatic cord injury. Ligamentous structures intact. Posterior Fossa, vertebral arteries, paraspinal tissues: Unremarkable. Disc levels: C5-6: Mild disc bulge. No significant spinal stenosis. Foramina remain patent. C6-7: Mild disc bulge with bilateral uncovertebral hypertrophy. No significant spinal stenosis. Moderate bilateral C7 foraminal stenosis. C7-T1: Negative interspace. Mild right-sided facet hypertrophy. No stenosis. MRI THORACIC SPINE FINDINGS Alignment:  Physiologic.  No listhesis. Vertebrae: Mild chronic compression deformities with height loss seen at the superior endplates of T1 through T5. No associated bony retropulsion. Otherwise, vertebral body height maintained. No acute fracture. Bone marrow signal intensity diffusely decreased on T1 weighted sequence, nonspecific, but most commonly related to anemia, smoking or obesity. No discrete or worrisome osseous lesions. No abnormal marrow edema. Cord: Normal signal and morphology. Ligamentous structures are intact. Paraspinal and other soft tissues: Unremarkable. Disc  levels: Unremarkable. No significant  disc pathology. No stenosis or impingement. MRI LUMBAR SPINE FINDINGS Segmentation: Standard. Lowest well-formed disc space labeled the L5-S1 level. Alignment: Physiologic with preservation of the normal lumbar lordosis. No listhesis. Vertebrae: Acute compression fractures involving the superior endplates of L1 and L2 again seen. Associated mild 20-25% height loss without bony retropulsion. Vertebral body height otherwise maintained. Bone marrow signal intensity diffusely decreased on T1 weighted sequence, nonspecific, but most commonly related to anemia, smoking or obesity. No discrete or worrisome osseous lesions. No other abnormal marrow edema. Conus medullaris and cauda equina: Conus extends to the T12-L1 level. Conus and cauda equina appear normal. Paraspinal and other soft tissues: Mild paraspinous edema adjacent to the acute L1 and L2 compression fractures. Visualized paraspinous soft tissues otherwise unremarkable. Visualized visceral structures within normal limits. Disc levels: No significant disc pathology seen within the lumbar spine. No disc bulge or focal disc herniation. Minimal lower lumbar facet hypertrophy. No stenosis or impingement. IMPRESSION: 1. Acute compression fractures involving the superior endplates of L1 and L2 with up to 20-25% height loss without bony retropulsion. 2. No other acute traumatic injury within the cervical, thoracic, or lumbar spine. 3. Mild degenerative spondylosis at C5-6 and C6-7 without significant spinal stenosis. Associated moderate bilateral C7 foraminal narrowing. Electronically Signed   By: Rise Mu M.D.   On: 03/19/2021 21:42   MR LUMBAR SPINE WO CONTRAST  Result Date: 03/19/2021 CLINICAL DATA:  Initial evaluation for acute trauma, fall. EXAM: MRI CERVICAL, THORACIC AND LUMBAR SPINE WITHOUT CONTRAST TECHNIQUE: Multiplanar and multiecho pulse sequences of the cervical spine, to include the craniocervical  junction and cervicothoracic junction, and thoracic and lumbar spine, were obtained without intravenous contrast. COMPARISON:  Prior CT from earlier the same day. FINDINGS: MRI CERVICAL SPINE FINDINGS Alignment: Physiologic with preservation of the normal cervical lordosis. No listhesis. Vertebrae: Vertebral body height maintained without acute or chronic fracture. Bone marrow signal intensity diffusely decreased on T1 weighted sequence, nonspecific, but most commonly related to anemia, smoking or obesity. No worrisome osseous lesions or abnormal marrow edema. Cord: Normal signal and morphology. No evidence for traumatic cord injury. Ligamentous structures intact. Posterior Fossa, vertebral arteries, paraspinal tissues: Unremarkable. Disc levels: C5-6: Mild disc bulge. No significant spinal stenosis. Foramina remain patent. C6-7: Mild disc bulge with bilateral uncovertebral hypertrophy. No significant spinal stenosis. Moderate bilateral C7 foraminal stenosis. C7-T1: Negative interspace. Mild right-sided facet hypertrophy. No stenosis. MRI THORACIC SPINE FINDINGS Alignment:  Physiologic.  No listhesis. Vertebrae: Mild chronic compression deformities with height loss seen at the superior endplates of T1 through T5. No associated bony retropulsion. Otherwise, vertebral body height maintained. No acute fracture. Bone marrow signal intensity diffusely decreased on T1 weighted sequence, nonspecific, but most commonly related to anemia, smoking or obesity. No discrete or worrisome osseous lesions. No abnormal marrow edema. Cord: Normal signal and morphology. Ligamentous structures are intact. Paraspinal and other soft tissues: Unremarkable. Disc levels: Unremarkable. No significant disc pathology. No stenosis or impingement. MRI LUMBAR SPINE FINDINGS Segmentation: Standard. Lowest well-formed disc space labeled the L5-S1 level. Alignment: Physiologic with preservation of the normal lumbar lordosis. No listhesis. Vertebrae:  Acute compression fractures involving the superior endplates of L1 and L2 again seen. Associated mild 20-25% height loss without bony retropulsion. Vertebral body height otherwise maintained. Bone marrow signal intensity diffusely decreased on T1 weighted sequence, nonspecific, but most commonly related to anemia, smoking or obesity. No discrete or worrisome osseous lesions. No other abnormal marrow edema. Conus medullaris and cauda equina: Conus extends to the T12-L1 level.  Conus and cauda equina appear normal. Paraspinal and other soft tissues: Mild paraspinous edema adjacent to the acute L1 and L2 compression fractures. Visualized paraspinous soft tissues otherwise unremarkable. Visualized visceral structures within normal limits. Disc levels: No significant disc pathology seen within the lumbar spine. No disc bulge or focal disc herniation. Minimal lower lumbar facet hypertrophy. No stenosis or impingement. IMPRESSION: 1. Acute compression fractures involving the superior endplates of L1 and L2 with up to 20-25% height loss without bony retropulsion. 2. No other acute traumatic injury within the cervical, thoracic, or lumbar spine. 3. Mild degenerative spondylosis at C5-6 and C6-7 without significant spinal stenosis. Associated moderate bilateral C7 foraminal narrowing. Electronically Signed   By: Rise Mu M.D.   On: 03/19/2021 21:42   DG Pelvis Portable  Result Date: 03/19/2021 CLINICAL DATA:  Trauma. EXAM: PORTABLE PELVIS 1-2 VIEWS COMPARISON:  None. FINDINGS: Portion of the RIGHT pelvis is off the field of view but okayed by the trauma surgeon. No acute bony abnormalities are noted. A RIGHT femoral catheter is noted. Bowel gas pattern is unremarkable. IMPRESSION: No acute bony abnormalities. Electronically Signed   By: Harmon Pier M.D.   On: 03/19/2021 15:52   CT CHEST ABDOMEN PELVIS W CONTRAST  Result Date: 03/19/2021 CLINICAL DATA:  Polytrauma, fall EXAM: CT CHEST, ABDOMEN, AND PELVIS  WITH CONTRAST CT THORACIC AND LUMBAR SPINE WITH CONTRAST TECHNIQUE: Multidetector CT imaging of the chest, abdomen and pelvis was performed following the standard protocol during bolus administration of intravenous contrast. Multidetector CT imaging of the thoracic and lumbar spine was performed following the standard protocol during bolus administration of intravenous contrast. Dedicated multiplanar reformats were generated and reviewed. RADIATION DOSE REDUCTION: This exam was performed according to the departmental dose-optimization program which includes automated exposure control, adjustment of the mA and/or kV according to patient size and/or use of iterative reconstruction technique. CONTRAST:  OMNIPAQUE IOHEXOL 350 MG/ML SOLN COMPARISON:  None. FINDINGS: CT CHEST FINDINGS Cardiovascular: Incidental calcified aneurysm or chronic focal dissection at the left aspect of the distal aortic arch, maximum caliber of the vessel at this site 4.0 x 3.3 cm (series 4, image 46, series 1, image 28). Normal heart size. No pericardial effusion. Mediastinum/Nodes: No enlarged mediastinal, hilar, or axillary lymph nodes. Thyroid gland, trachea, and esophagus demonstrate no significant findings. Lungs/Pleura: Mild paraseptal emphysema. No pleural effusion or pneumothorax. Musculoskeletal: No chest wall mass or suspicious osseous lesions identified. CT ABDOMEN PELVIS FINDINGS Hepatobiliary: No solid liver abnormality is seen. No gallstones, gallbladder wall thickening, or biliary dilatation. Pancreas: Unremarkable. No pancreatic ductal dilatation or surrounding inflammatory changes. Spleen: Normal in size without significant abnormality. Adrenals/Urinary Tract: Adrenal glands are unremarkable. Kidneys are normal, without renal calculi, solid lesion, or hydronephrosis. Bladder is unremarkable. Stomach/Bowel: Stomach is within normal limits. Appendix appears normal. No evidence of bowel wall thickening, distention, or  inflammatory changes. Vascular/Lymphatic: Right femoral venous catheter. No enlarged abdominal or pelvic lymph nodes. Reproductive: No mass or other abnormality. Other: Status post umbilical hernia repair.  No ascites. Musculoskeletal: No acute osseous findings. CT THORACIC AND LUMBAR SPINE FINDINGS Alignment: Normal. Vertebral bodies: Acute superior endplate wedge fractures of L1 and L2 with less than 25% anterior height loss (series 5, image 56). Disc spaces: Intact. Soft tissues: Unremarkable.  No evident hematoma. IMPRESSION: 1. Acute superior endplate wedge fractures of L1 and L2 with less than 25% anterior height loss. No other fracture or dislocation identified. 2. No evidence of acute traumatic injury to the organs of the chest,  abdomen, or pelvis. 3. 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 Although this does not reflect acute traumatic vascular injury, recommend referral to vascular specialist on a nonemergent, outpatient basis. This recommendation follows 2010 ACCF/AHA/AATS/ACR/ASA/SCA/SCAI/SIR/STS/SVM Guidelines for the Diagnosis and Management of Patients With Thoracic Aortic Disease. Circulation. 2010; 121: X323-F573. Aortic aneurysm NOS (ICD10-I71.9) 4. Mild emphysema. Acute findings were called by telephone at the time of interpretation on 03/19/2021 at 4:22 pm to Dr. Kris Mouton, Who verbally acknowledged these results. Emphysema (ICD10-J43.9). Electronically Signed   By: Jearld Lesch M.D.   On: 03/19/2021 16:24   CT T-SPINE NO CHARGE  Result Date: 03/19/2021 CLINICAL DATA:  Polytrauma, fall EXAM: CT CHEST, ABDOMEN, AND PELVIS WITH CONTRAST CT THORACIC AND LUMBAR SPINE WITH CONTRAST TECHNIQUE: Multidetector CT imaging of the chest, abdomen and pelvis was performed following the standard protocol during bolus administration of intravenous contrast.  Multidetector CT imaging of the thoracic and lumbar spine was performed following the standard protocol during bolus administration of intravenous contrast. Dedicated multiplanar reformats were generated and reviewed. RADIATION DOSE REDUCTION: This exam was performed according to the departmental dose-optimization program which includes automated exposure control, adjustment of the mA and/or kV according to patient size and/or use of iterative reconstruction technique. CONTRAST:  OMNIPAQUE IOHEXOL 350 MG/ML SOLN COMPARISON:  None. FINDINGS: CT CHEST FINDINGS Cardiovascular: Incidental calcified aneurysm or chronic focal dissection at the left aspect of the distal aortic arch, maximum caliber of the vessel at this site 4.0 x 3.3 cm (series 4, image 46, series 1, image 28). Normal heart size. No pericardial effusion. Mediastinum/Nodes: No enlarged mediastinal, hilar, or axillary lymph nodes. Thyroid gland, trachea, and esophagus demonstrate no significant findings. Lungs/Pleura: Mild paraseptal emphysema. No pleural effusion or pneumothorax. Musculoskeletal: No chest wall mass or suspicious osseous lesions identified. CT ABDOMEN PELVIS FINDINGS Hepatobiliary: No solid liver abnormality is seen. No gallstones, gallbladder wall thickening, or biliary dilatation. Pancreas: Unremarkable. No pancreatic ductal dilatation or surrounding inflammatory changes. Spleen: Normal in size without significant abnormality. Adrenals/Urinary Tract: Adrenal glands are unremarkable. Kidneys are normal, without renal calculi, solid lesion, or hydronephrosis. Bladder is unremarkable. Stomach/Bowel: Stomach is within normal limits. Appendix appears normal. No evidence of bowel wall thickening, distention, or inflammatory changes. Vascular/Lymphatic: Right femoral venous catheter. No enlarged abdominal or pelvic lymph nodes. Reproductive: No mass or other abnormality. Other: Status post umbilical hernia repair.  No ascites.  Musculoskeletal: No acute osseous findings. CT THORACIC AND LUMBAR SPINE FINDINGS Alignment: Normal. Vertebral bodies: Acute superior endplate wedge fractures of L1 and L2 with less than 25% anterior height loss (series 5, image 56). Disc spaces: Intact. Soft tissues: Unremarkable.  No evident hematoma. IMPRESSION: 1. Acute superior endplate wedge fractures of L1 and L2 with less than 25% anterior height loss. No other fracture or dislocation identified. 2. No evidence of acute traumatic injury to the organs of the chest, abdomen, or pelvis. 3. 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 Although this does not reflect acute traumatic vascular injury, recommend referral to vascular specialist on a nonemergent, outpatient basis. This recommendation follows 2010 ACCF/AHA/AATS/ACR/ASA/SCA/SCAI/SIR/STS/SVM Guidelines for the Diagnosis and Management of Patients With Thoracic Aortic Disease. Circulation. 2010; 121: U202-R427. Aortic aneurysm NOS (ICD10-I71.9) 4. Mild  emphysema. Acute findings were called by telephone at the time of interpretation on 03/19/2021 at 4:22 pm to Dr. Kris Mouton, Who verbally acknowledged these results. Emphysema (ICD10-J43.9). Electronically Signed   By: Jearld Lesch M.D.   On: 03/19/2021 16:24   CT L-SPINE NO CHARGE  Result Date: 03/19/2021 CLINICAL DATA:  Polytrauma, fall EXAM: CT CHEST, ABDOMEN, AND PELVIS WITH CONTRAST CT THORACIC AND LUMBAR SPINE WITH CONTRAST TECHNIQUE: Multidetector CT imaging of the chest, abdomen and pelvis was performed following the standard protocol during bolus administration of intravenous contrast. Multidetector CT imaging of the thoracic and lumbar spine was performed following the standard protocol during bolus administration of intravenous contrast. Dedicated multiplanar reformats were generated and reviewed. RADIATION  DOSE REDUCTION: This exam was performed according to the departmental dose-optimization program which includes automated exposure control, adjustment of the mA and/or kV according to patient size and/or use of iterative reconstruction technique. CONTRAST:  OMNIPAQUE IOHEXOL 350 MG/ML SOLN COMPARISON:  None. FINDINGS: CT CHEST FINDINGS Cardiovascular: Incidental calcified aneurysm or chronic focal dissection at the left aspect of the distal aortic arch, maximum caliber of the vessel at this site 4.0 x 3.3 cm (series 4, image 46, series 1, image 28). Normal heart size. No pericardial effusion. Mediastinum/Nodes: No enlarged mediastinal, hilar, or axillary lymph nodes. Thyroid gland, trachea, and esophagus demonstrate no significant findings. Lungs/Pleura: Mild paraseptal emphysema. No pleural effusion or pneumothorax. Musculoskeletal: No chest wall mass or suspicious osseous lesions identified. CT ABDOMEN PELVIS FINDINGS Hepatobiliary: No solid liver abnormality is seen. No gallstones, gallbladder wall thickening, or biliary dilatation. Pancreas: Unremarkable. No pancreatic ductal dilatation or surrounding inflammatory changes. Spleen: Normal in size without significant abnormality. Adrenals/Urinary Tract: Adrenal glands are unremarkable. Kidneys are normal, without renal calculi, solid lesion, or hydronephrosis. Bladder is unremarkable. Stomach/Bowel: Stomach is within normal limits. Appendix appears normal. No evidence of bowel wall thickening, distention, or inflammatory changes. Vascular/Lymphatic: Right femoral venous catheter. No enlarged abdominal or pelvic lymph nodes. Reproductive: No mass or other abnormality. Other: Status post umbilical hernia repair.  No ascites. Musculoskeletal: No acute osseous findings. CT THORACIC AND LUMBAR SPINE FINDINGS Alignment: Normal. Vertebral bodies: Acute superior endplate wedge fractures of L1 and L2 with less than 25% anterior height loss (series 5, image 56). Disc  spaces: Intact. Soft tissues: Unremarkable.  No evident hematoma. IMPRESSION: 1. Acute superior endplate wedge fractures of L1 and L2 with less than 25% anterior height loss. No other fracture or dislocation identified. 2. No evidence of acute traumatic injury to the organs of the chest, abdomen, or pelvis. 3. 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 Although this does not reflect acute traumatic vascular injury, recommend referral to vascular specialist on a nonemergent, outpatient basis. This recommendation follows 2010 ACCF/AHA/AATS/ACR/ASA/SCA/SCAI/SIR/STS/SVM Guidelines for the Diagnosis and Management of Patients With Thoracic Aortic Disease. Circulation. 2010; 121: Z224-M250. Aortic aneurysm NOS (ICD10-I71.9) 4. Mild emphysema. Acute findings were called by telephone at the time of interpretation on 03/19/2021 at 4:22 pm to Dr. Kris Mouton, Who verbally acknowledged these results. Emphysema (ICD10-J43.9). Electronically Signed   By: Jearld Lesch M.D.   On: 03/19/2021 16:24   DG Chest Portable 1 View  Result Date: 03/19/2021 CLINICAL DATA:  Trauma. EXAM: PORTABLE CHEST 1 VIEW COMPARISON:  None. FINDINGS: Of note, the costophrenic angles are not included on the field of view. The heart size  and mediastinal contours are within normal limits. Aortic calcifications. Both lungs are clear. No large volume pleural effusion or pneumothorax. Old fractures of the right fourth through seventh ribs. IMPRESSION: No acute cardiopulmonary or osseous abnormality. Electronically Signed   By: Sherron Ales M.D.   On: 03/19/2021 15:53   DG Knee Right Port  Result Date: 03/19/2021 CLINICAL DATA:  46 year old male with possible metal in RIGHT knee. Screen for MRI. EXAM: PORTABLE RIGHT KNEE - 1-2 VIEW COMPARISON:  None. FINDINGS: There is no evidence of metallic foreign body in the RIGHT  knee. The bony structures and joint spaces are unremarkable. No fracture, subluxation, dislocation or focal bony lesions identified. There is no evidence of joint effusion. IMPRESSION: Negative.  No evidence of metallic foreign body. Electronically Signed   By: Harmon Pier M.D.   On: 03/19/2021 19:18    Anti-infectives: Anti-infectives (From admission, onward)    None        Assessment/Plan FFH L1-2 superior endplate fractures - NSGY c/s, Dr. Maurice Small, MRI negative for cord injury.  TLSO brace when OOB, therapies ordered.  D/w Dr. Maurice Small Paraplegia, poor rectal tone - unclear etiology, as this is unilateral of LLE, ? Some conversion disorder present, but he didn't respond to sharp touch with neuro exam this morning, but had some quad firing with attempt to lift leg Suicide attempt - psych consult pending FEN - regular diet, IVFs DVT - SCDs, Lovenox Dispo - await bed, await therapies and psych consult recs   Moderate Medical Decision Making  LOS: 1 day    Letha Cape , [facility] Surgery 03/20/2021, 10:01 AM Please see Amion for pager number during day hours 7:00am-4:30pm or 7:00am -11:30am on weekends

## 2021-03-20 NOTE — Progress Notes (Signed)
PT Cancellation Note  Patient Details Name: Gray Doering MRN: [id number] DOB: Apr 11, 1975   Cancelled Treatment:    Reason Eval/Treat Not Completed: Medical issues which prohibited therapy. Per discussion with trauma pt requires TLSO brace prior to mobilization. PT will follow up once brace is delivered.   Arlyss Gandy 03/20/2021, 9:36 AM

## 2021-03-20 NOTE — Progress Notes (Signed)
Exam improving with some return of sensation and some more movement in the BLE, MRI C/T/L-spine reviewed, no evidence of cord lesion or ligamentous injury, known L1/L2 fractures w/o retropulsion / no canal stenosis.   -no neurosurgical intervention indicated -TLSO for L1 and L2 fractures, pt should wear when he's ambulating or out of bed, otherwise can wear for comfort

## 2021-03-20 NOTE — Progress Notes (Signed)
Trauma Event Note  TRN rounded on patient. Pt stable at this time, VS WDL. Sitter at bedside.  Last imported Vital Signs BP 133/85 (BP Location: Right Arm)    Pulse 83    Temp 98.6 F (37 C) (Oral)    Resp (!) 21    Ht 6' (1.829 m)    Wt 175 lb 0.7 oz (79.4 kg)    SpO2 96%    BMI 23.74 kg/m   Trending CBC Recent Labs    03/19/21 1536 03/19/21 1544 03/20/21 0601  WBC 9.9  --  10.4  HGB 15.1 15.0 12.3*  HCT 46.1 44.0 37.4*  PLT 273  --  187    Trending Coag's Recent Labs    03/19/21 1536  INR 1.2    Trending BMET Recent Labs    03/19/21 1536 03/19/21 1544 03/20/21 0601  NA 141 144 142  K 3.9 3.9 3.5  CL 107 108 109  CO2 24  --  25  BUN 13 13 13   CREATININE 1.33* 1.20 1.40*  GLUCOSE 101* 97 87      Klinton Candelas O Roshanda Balazs  Trauma Response RN  Please call TRN at [phone] for further assistance.

## 2021-03-20 NOTE — Progress Notes (Signed)
OT Cancellation Note  Patient Details Name: Kerry Scott MRN: [id number] DOB: 1975-08-21   Cancelled Treatment:    Reason Eval/Treat Not Completed: Patient at procedure or test/ unavailable.  Attempted to see several times.  First attempt pt with psychiatrist, second, and third pt with IV nursing.  Will reattempt.  Nilsa Nutting., OTR/L Acute Rehabilitation Services Pager [phone] Office [phone]   Lucille Passy M 03/20/2021, 4:06 PM

## 2021-03-20 NOTE — ED Provider Notes (Signed)
.  Critical Care Performed by: Terald Sleeper, MD Authorized by: Terald Sleeper, MD   Critical care provider statement:    Critical care time (minutes):  45   Critical care time was exclusive of:  Separately billable procedures and treating other patients   Critical care was necessary to treat or prevent imminent or life-threatening deterioration of the following conditions:  Trauma   Critical care was time spent personally by me on the following activities:  Ordering and performing treatments and interventions, ordering and review of laboratory studies, ordering and review of radiographic studies, pulse oximetry, review of old charts, examination of patient and evaluation of patient's response to treatment Comments:     Level 1 trauma evaluation - neuro reassessments - consultant discussion    Terald Sleeper, MD 03/20/21 1023

## 2021-03-20 NOTE — ED Notes (Signed)
Provider paged at pt request for a diet order.

## 2021-03-20 NOTE — ED Notes (Signed)
Verbal report given to Aryana T. RN at this time

## 2021-03-20 NOTE — ED Notes (Signed)
Trauma at bedside.

## 2021-03-20 NOTE — Progress Notes (Signed)
Assessed BUE for PIV vs midline.  LA noted with upper forearm hard and lumpy in appearance, scabbed area and noncompressible with ultrasound.  Pt states had purulent drainage at the scabbed area 2 days ago.  Deferrred to RUA for midline placement. RFA also with smaller hard area that is also scabbed.  Pt states no drainage from that site.

## 2021-03-20 NOTE — Evaluation (Signed)
Physical Therapy Evaluation Patient Details Name: Kerry Scott MRN: [id number] DOB: 25-Dec-1975 Today's Date: 03/20/2021  History of Present Illness  46 y.o. male presents to [facility] hospital on 03/19/2021 after jumping from a 20' balcony. Pt found to have L1-2 superior endplate fxs. Pt reporting quadriplegia since fall although no cord compression noted on MRI from 2/18. No PMH of note.  Clinical Impression  Pt presents to PT with reported deficits in strength. Pt demonstrates many inconsistencies in mobility and strength during session, often unable to move L arm or leg when requested to specifically utilize said extremity, however moving extremity at times during functional mobility as described below. Pt demonstrates improvement in mobility and initiation of gait training with 2nd attempt, and will continue to benefit from aggressive mobilization in an effort to restore independence. Pt will benefit from further acute PT services prior to discharge, however this PT anticipates the pt will progress quickly with continued PT services.     Recommendations for follow up therapy are one component of a multi-disciplinary discharge planning process, led by the attending physician.  Recommendations may be updated based on patient status, additional functional criteria and insurance authorization.  Follow Up Recommendations Other (comment) (PT services at correctional facility)    Assistance Recommended at Discharge Frequent or constant Supervision/Assistance  Patient can return home with the following  A lot of help with walking and/or transfers;A little help with bathing/dressing/bathroom;Help with stairs or ramp for entrance;Assist for transportation;Assistance with cooking/housework    Equipment Recommendations Rolling walker (2 wheels);Wheelchair (measurements PT);Wheelchair cushion (measurements PT) (to be further assessed)  Recommendations for Other Services       Functional Status Assessment  Patient has had a recent decline in their functional status and demonstrates the ability to make significant improvements in function in a reasonable and predictable amount of time.     Precautions / Restrictions Precautions Precautions: Fall;Back Precaution Booklet Issued: Yes (comment) Required Braces or Orthoses: Spinal Brace Spinal Brace: Thoracolumbosacral orthotic;Applied in sitting position (maxA to don, supervision to doff) Restrictions Weight Bearing Restrictions: No      Mobility  Bed Mobility Overal bed mobility: Needs Assistance Bed Mobility: Rolling, Sidelying to Sit, Sit to Sidelying Rolling: Supervision Sidelying to sit: Supervision     Sit to sidelying: Supervision General bed mobility comments: verbal cues for technique, pt utilizing UEs to reposition LLE    Transfers Overall transfer level: Needs assistance Equipment used: Rolling walker (2 wheels) Transfers: Sit to/from Stand Sit to Stand: Mod assist, Min assist           General transfer comment: modA initiall without walker, minA with RW, verbal cues for hand placement    Ambulation/Gait Ambulation/Gait assistance: Mod assist Gait Distance (Feet): 3 Feet Assistive device: Rolling walker (2 wheels) Gait Pattern/deviations: Step-to pattern, Decreased dorsiflexion - left (anterior lean) Gait velocity: reduced Gait velocity interpretation: <1.31 ft/sec, indicative of household ambulator   General Gait Details: pt with anterior trunk lean when attempting to advance lower extremities. PT provides cues to weight shift right to aide in offloading LLE. PT with more success with 2nd attempt at forward and backward stepping. PT notes R knee with mild buckling however no instances of L knee buckling  Stairs            Wheelchair Mobility    Modified Rankin (Stroke Patients Only)       Balance Overall balance assessment: Needs assistance Sitting-balance support: No upper extremity supported,  Feet supported Sitting balance-Leahy Scale: Fair Sitting balance -  Comments: pt leaning anteriorly initially, posterior later in session, able to correct balance deviations with trunk or UE support of bed. Pt appears to pull on mattress with LUE to correct one posterior lean Postural control: Posterior lean Standing balance support: Bilateral upper extremity supported, Reliant on assistive device for balance Standing balance-Leahy Scale: Poor Standing balance comment: min-modA with BUE support of RW                             Pertinent Vitals/Pain Pain Assessment Pain Assessment: 0-10 Pain Score: 10-Worst pain ever Pain Location: back Pain Descriptors / Indicators: Aching Pain Intervention(s): Monitored during session    Home Living Family/patient expects to be discharged to:: Other (Comment)                   Additional Comments: correctional facility    Prior Function Prior Level of Function : Independent/Modified Independent                     Hand Dominance        Extremity/Trunk Assessment   Upper Extremity Assessment Upper Extremity Assessment: LUE deficits/detail LUE Deficits / Details: pt demonstrates the ability to grip socks during lower body dressing, pushes up into sitting off left elbow, when asked for formal assessment of mobility pt reports limited to no movement of LUE, although LUE in flexed position with hand resting on top of pt's head upon PT arrival, elbow unsupported    Lower Extremity Assessment Lower Extremity Assessment: LLE deficits/detail LLE Deficits / Details: pt demonstrates the ability to wiggle toes, flexes knee when PT attempts to remove SCD. Pt does not demonstrate knee buckling in standing. Pt does consistently pick up LLE with hands when asked to reposition.    Cervical / Trunk Assessment Cervical / Trunk Assessment: Normal  Communication   Communication: No difficulties  Cognition Arousal/Alertness:  Awake/alert Behavior During Therapy: WFL for tasks assessed/performed Overall Cognitive Status: Within Functional Limits for tasks assessed                                          General Comments General comments (skin integrity, edema, etc.): VSS on RA    Exercises Other Exercises Other Exercises: PT encourages ankle pumps, quad sets and glute sets with LLE   Assessment/Plan    PT Assessment Patient needs continued PT services  PT Problem List Decreased strength;Decreased activity tolerance;Decreased balance;Decreased mobility;Decreased cognition;Decreased knowledge of precautions;Decreased knowledge of use of DME       PT Treatment Interventions DME instruction;Gait training;Functional mobility training;Therapeutic activities;Stair training;Therapeutic exercise;Balance training;Neuromuscular re-education;Patient/family education;Cognitive remediation    PT Goals (Current goals can be found in the Care Plan section)  Acute Rehab PT Goals Patient Stated Goal: to regain strength PT Goal Formulation: With patient Time For Goal Achievement: 04/03/21 Potential to Achieve Goals: Good    Frequency Min 5X/week     Co-evaluation               AM-PAC PT "6 Clicks" Mobility  Outcome Measure Help needed turning from your back to your side while in a flat bed without using bedrails?: A Little Help needed moving from lying on your back to sitting on the side of a flat bed without using bedrails?: A Little Help needed moving to and from a bed to a chair (  including a wheelchair)?: A Lot Help needed standing up from a chair using your arms (e.g., wheelchair or bedside chair)?: A Little Help needed to walk in hospital room?: Total Help needed climbing 3-5 steps with a railing? : Total 6 Click Score: 13    End of Session Equipment Utilized During Treatment: Back brace Activity Tolerance: Patient tolerated treatment well Patient left: in bed;with call bell/phone  within reach;with bed alarm set;with nursing/sitter in room Nurse Communication: Mobility status PT Visit Diagnosis: Other abnormalities of gait and mobility (R26.89);Muscle weakness (generalized) (M62.81);Other symptoms and signs involving the nervous system (R29.898);Pain Pain - part of body:  (back)    Time: [id number] PT Time Calculation (min) (ACUTE ONLY): 38 min   Charges:   PT Evaluation $PT Eval Moderate Complexity: 1 Mod PT Treatments $Therapeutic Activity: 8-22 mins        Zenaida Niece, PT, DPT Acute Rehabilitation Pager: [phone] Office [phone]   Zenaida Niece 03/20/2021, 5:32 PM

## 2021-03-20 NOTE — Progress Notes (Addendum)
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
## 2021-03-21 ENCOUNTER — Encounter (HOSPITAL_COMMUNITY): Payer: Self-pay

## 2021-03-21 DIAGNOSIS — Z9189 Other specified personal risk factors, not elsewhere classified: Secondary | ICD-10-CM

## 2021-03-21 LAB — CBC
HCT: 37.3 % — ABNORMAL LOW (ref 39.0–52.0)
Hemoglobin: 12.3 g/dL — ABNORMAL LOW (ref 13.0–17.0)
MCH: 26.3 pg (ref 26.0–34.0)
MCHC: 33 g/dL (ref 30.0–36.0)
MCV: 79.9 fL — ABNORMAL LOW (ref 80.0–100.0)
Platelets: 146 10*3/uL — ABNORMAL LOW (ref 150–400)
RBC: 4.67 MIL/uL (ref 4.22–5.81)
RDW: 16.2 % — ABNORMAL HIGH (ref 11.5–15.5)
WBC: 11.3 10*3/uL — ABNORMAL HIGH (ref 4.0–10.5)
nRBC: 0 % (ref 0.0–0.2)

## 2021-03-21 LAB — BASIC METABOLIC PANEL
Anion gap: 12 (ref 5–15)
BUN: 18 mg/dL (ref 6–20)
CO2: 19 mmol/L — ABNORMAL LOW (ref 22–32)
Calcium: 8.4 mg/dL — ABNORMAL LOW (ref 8.9–10.3)
Chloride: 113 mmol/L — ABNORMAL HIGH (ref 98–111)
Creatinine, Ser: 2.31 mg/dL — ABNORMAL HIGH (ref 0.61–1.24)
GFR, Estimated: 35 mL/min — ABNORMAL LOW (ref >60)
Glucose, Bld: 85 mg/dL (ref 70–99)
Potassium: 5.4 mmol/L — ABNORMAL HIGH (ref 3.5–5.1)
Sodium: 144 mmol/L (ref 135–145)

## 2021-03-21 MED ORDER — IBUPROFEN 100 MG PO TABS: 600.0000 mg | ORAL_TABLET | Freq: Four times a day (QID) | ORAL | 2 refills | Status: AC | PRN

## 2021-03-21 MED ORDER — OXYCODONE HCL 10 MG PO TABS: 5.0000 mg | ORAL_TABLET | Freq: Four times a day (QID) | ORAL | 0 refills | Status: AC | PRN

## 2021-03-21 MED ORDER — METHOCARBAMOL 1000 MG PO TABS
1000.0000 mg | ORAL_TABLET | Freq: Three times a day (TID) | ORAL | Status: AC | PRN
Start: 2021-03-21 — End: ?

## 2021-03-21 MED ORDER — POLYETHYLENE GLYCOL 3350 17 G PO PACK
17.0000 g | PACK | Freq: Every day | ORAL | 0 refills | Status: AC | PRN
Start: 2021-03-21 — End: ?

## 2021-03-21 MED ORDER — DOCUSATE SODIUM 100 MG PO CAPS: 100.0000 mg | ORAL_CAPSULE | Freq: Two times a day (BID) | ORAL | 0 refills | Status: AC

## 2021-03-21 MED ORDER — TRAZODONE HCL 50 MG PO TABS: 50.0000 mg | ORAL_TABLET | Freq: Every day | ORAL | Status: DC

## 2021-03-21 MED ORDER — THIAMINE HCL 100 MG PO TABS: 100.0000 mg | ORAL_TABLET | Freq: Every day | ORAL | Status: AC

## 2021-03-21 MED ORDER — ACETAMINOPHEN 500 MG PO TABS
1000.0000 mg | ORAL_TABLET | Freq: Four times a day (QID) | ORAL | 0 refills | Status: AC | PRN
Start: 2021-03-21 — End: ?

## 2021-03-21 MED ORDER — FOLIC ACID 1 MG PO TABS: 1.0000 mg | ORAL_TABLET | Freq: Every day | ORAL | Status: AC

## 2021-03-21 MED ORDER — ADULT MULTIVITAMIN W/MINERALS CH: 1.0000 | ORAL_TABLET | Freq: Every day | ORAL | Status: AC

## 2021-03-21 MED ORDER — NICOTINE 7 MG/24HR TD PT24: 7.0000 mg | MEDICATED_PATCH | Freq: Every day | TRANSDERMAL | 0 refills | Status: AC

## 2021-03-21 MED ORDER — TRAZODONE HCL 50 MG PO TABS: 50.0000 mg | ORAL_TABLET | Freq: Every day | ORAL | Status: AC

## 2021-03-21 NOTE — Discharge Summary (Signed)
[facility] Discharge Summary   Patient ID: Kerry Scott MRN: [id number] DOB/AGE: 1975/11/08 46 y.o.  Admit date: 03/19/2021 Discharge date: 03/21/2021  Admitting Diagnosis: Fall from height L1-2 superior endplate fractures  Paraplegia, poor rectal tone  Suicide attempt   Discharge Diagnosis Fall from height L1-2 superior endplate fractures Paraplegia, poor rectal tone - resolved, question conversion d/o vs malingering  Suicide attempt   Consultants Neurosurgery  Imaging: CT HEAD WO CONTRAST ( )  Result Date: 03/19/2021 CLINICAL DATA:  Head and neck trauma, fall EXAM: CT HEAD WITHOUT CONTRAST CT CERVICAL SPINE WITHOUT CONTRAST TECHNIQUE: Multidetector CT imaging of the head and cervical spine was performed following the standard protocol without intravenous contrast. Multiplanar CT image reconstructions of the cervical spine were also generated. RADIATION DOSE REDUCTION: This exam was performed according to the departmental dose-optimization program which includes automated exposure control, adjustment of the mA and/or kV according to patient size and/or use of iterative reconstruction technique. COMPARISON:  None. FINDINGS: CT HEAD FINDINGS Brain: No evidence of acute infarction, hemorrhage, hydrocephalus, extra-axial collection or mass lesion/mass effect. Vascular: No hyperdense vessel or unexpected calcification. Skull: Normal. Negative for fracture or focal lesion. Sinuses/Orbits: No acute finding. Other: None. CT CERVICAL SPINE FINDINGS Examination of the cervical spine is mildly limited by motion artifact throughout. Alignment: Normal. Skull base and vertebrae: No acute fracture. No primary bone lesion or focal pathologic process. Soft tissues and spinal canal: No prevertebral fluid or swelling. No visible canal hematoma. Disc levels: Focally mild disc space height loss and osteophytosis of C6-C7. Disc spaces are otherwise preserved. Upper chest: Negative. Other: None.  IMPRESSION: 1. No acute intracranial pathology. 2. Examination of the cervical spine is mildly limited by motion artifact throughout. Within this limitation, no fracture or static subluxation of the cervical spine. These results were called by telephone at the time of interpretation on 03/19/2021 at 4:25 pm to Dr. Kris Mouton, who verbally acknowledged these results. Electronically Signed   By: Jearld Lesch M.D.   On: 03/19/2021 16:26   CT Cervical Spine Wo Contrast  Result Date: 03/19/2021 CLINICAL DATA:  Head and neck trauma, fall EXAM: CT HEAD WITHOUT CONTRAST CT CERVICAL SPINE WITHOUT CONTRAST TECHNIQUE: Multidetector CT imaging of the head and cervical spine was performed following the standard protocol without intravenous contrast. Multiplanar CT image reconstructions of the cervical spine were also generated. RADIATION DOSE REDUCTION: This exam was performed according to the departmental dose-optimization program which includes automated exposure control, adjustment of the mA and/or kV according to patient size and/or use of iterative reconstruction technique. COMPARISON:  None. FINDINGS: CT HEAD FINDINGS Brain: No evidence of acute infarction, hemorrhage, hydrocephalus, extra-axial collection or mass lesion/mass effect. Vascular: No hyperdense vessel or unexpected calcification. Skull: Normal. Negative for fracture or focal lesion. Sinuses/Orbits: No acute finding. Other: None. CT CERVICAL SPINE FINDINGS Examination of the cervical spine is mildly limited by motion artifact throughout. Alignment: Normal. Skull base and vertebrae: No acute fracture. No primary bone lesion or focal pathologic process. Soft tissues and spinal canal: No prevertebral fluid or swelling. No visible canal hematoma. Disc levels: Focally mild disc space height loss and osteophytosis of C6-C7. Disc spaces are otherwise preserved. Upper chest: Negative. Other: None. IMPRESSION: 1. No acute intracranial pathology. 2. Examination of  the cervical spine is mildly limited by motion artifact throughout. Within this limitation, no fracture or static subluxation of the cervical spine. These results were called by telephone at the time of interpretation on 03/19/2021 at 4:25 pm to Dr.  Kris Mouton, who verbally acknowledged these results. Electronically Signed   By: Jearld Lesch M.D.   On: 03/19/2021 16:26   MR CERVICAL SPINE WO CONTRAST  Result Date: 03/19/2021 CLINICAL DATA:  Initial evaluation for acute trauma, fall. EXAM: MRI CERVICAL, THORACIC AND LUMBAR SPINE WITHOUT CONTRAST TECHNIQUE: Multiplanar and multiecho pulse sequences of the cervical spine, to include the craniocervical junction and cervicothoracic junction, and thoracic and lumbar spine, were obtained without intravenous contrast. COMPARISON:  Prior CT from earlier the same day. FINDINGS: MRI CERVICAL SPINE FINDINGS Alignment: Physiologic with preservation of the normal cervical lordosis. No listhesis. Vertebrae: Vertebral body height maintained without acute or chronic fracture. Bone marrow signal intensity diffusely decreased on T1 weighted sequence, nonspecific, but most commonly related to anemia, smoking or obesity. No worrisome osseous lesions or abnormal marrow edema. Cord: Normal signal and morphology. No evidence for traumatic cord injury. Ligamentous structures intact. Posterior Fossa, vertebral arteries, paraspinal tissues: Unremarkable. Disc levels: C5-6: Mild disc bulge. No significant spinal stenosis. Foramina remain patent. C6-7: Mild disc bulge with bilateral uncovertebral hypertrophy. No significant spinal stenosis. Moderate bilateral C7 foraminal stenosis. C7-T1: Negative interspace. Mild right-sided facet hypertrophy. No stenosis. MRI THORACIC SPINE FINDINGS Alignment:  Physiologic.  No listhesis. Vertebrae: Mild chronic compression deformities with height loss seen at the superior endplates of T1 through T5. No associated bony retropulsion. Otherwise,  vertebral body height maintained. No acute fracture. Bone marrow signal intensity diffusely decreased on T1 weighted sequence, nonspecific, but most commonly related to anemia, smoking or obesity. No discrete or worrisome osseous lesions. No abnormal marrow edema. Cord: Normal signal and morphology. Ligamentous structures are intact. Paraspinal and other soft tissues: Unremarkable. Disc levels: Unremarkable. No significant disc pathology. No stenosis or impingement. MRI LUMBAR SPINE FINDINGS Segmentation: Standard. Lowest well-formed disc space labeled the L5-S1 level. Alignment: Physiologic with preservation of the normal lumbar lordosis. No listhesis. Vertebrae: Acute compression fractures involving the superior endplates of L1 and L2 again seen. Associated mild 20-25% height loss without bony retropulsion. Vertebral body height otherwise maintained. Bone marrow signal intensity diffusely decreased on T1 weighted sequence, nonspecific, but most commonly related to anemia, smoking or obesity. No discrete or worrisome osseous lesions. No other abnormal marrow edema. Conus medullaris and cauda equina: Conus extends to the T12-L1 level. Conus and cauda equina appear normal. Paraspinal and other soft tissues: Mild paraspinous edema adjacent to the acute L1 and L2 compression fractures. Visualized paraspinous soft tissues otherwise unremarkable. Visualized visceral structures within normal limits. Disc levels: No significant disc pathology seen within the lumbar spine. No disc bulge or focal disc herniation. Minimal lower lumbar facet hypertrophy. No stenosis or impingement. IMPRESSION: 1. Acute compression fractures involving the superior endplates of L1 and L2 with up to 20-25% height loss without bony retropulsion. 2. No other acute traumatic injury within the cervical, thoracic, or lumbar spine. 3. Mild degenerative spondylosis at C5-6 and C6-7 without significant spinal stenosis. Associated moderate bilateral C7  foraminal narrowing. Electronically Signed   By: Rise Mu M.D.   On: 03/19/2021 21:42   MR THORACIC SPINE WO CONTRAST  Result Date: 03/19/2021 CLINICAL DATA:  Initial evaluation for acute trauma, fall. EXAM: MRI CERVICAL, THORACIC AND LUMBAR SPINE WITHOUT CONTRAST TECHNIQUE: Multiplanar and multiecho pulse sequences of the cervical spine, to include the craniocervical junction and cervicothoracic junction, and thoracic and lumbar spine, were obtained without intravenous contrast. COMPARISON:  Prior CT from earlier the same day. FINDINGS: MRI CERVICAL SPINE FINDINGS Alignment: Physiologic with preservation of the normal cervical  lordosis. No listhesis. Vertebrae: Vertebral body height maintained without acute or chronic fracture. Bone marrow signal intensity diffusely decreased on T1 weighted sequence, nonspecific, but most commonly related to anemia, smoking or obesity. No worrisome osseous lesions or abnormal marrow edema. Cord: Normal signal and morphology. No evidence for traumatic cord injury. Ligamentous structures intact. Posterior Fossa, vertebral arteries, paraspinal tissues: Unremarkable. Disc levels: C5-6: Mild disc bulge. No significant spinal stenosis. Foramina remain patent. C6-7: Mild disc bulge with bilateral uncovertebral hypertrophy. No significant spinal stenosis. Moderate bilateral C7 foraminal stenosis. C7-T1: Negative interspace. Mild right-sided facet hypertrophy. No stenosis. MRI THORACIC SPINE FINDINGS Alignment:  Physiologic.  No listhesis. Vertebrae: Mild chronic compression deformities with height loss seen at the superior endplates of T1 through T5. No associated bony retropulsion. Otherwise, vertebral body height maintained. No acute fracture. Bone marrow signal intensity diffusely decreased on T1 weighted sequence, nonspecific, but most commonly related to anemia, smoking or obesity. No discrete or worrisome osseous lesions. No abnormal marrow edema. Cord: Normal signal  and morphology. Ligamentous structures are intact. Paraspinal and other soft tissues: Unremarkable. Disc levels: Unremarkable. No significant disc pathology. No stenosis or impingement. MRI LUMBAR SPINE FINDINGS Segmentation: Standard. Lowest well-formed disc space labeled the L5-S1 level. Alignment: Physiologic with preservation of the normal lumbar lordosis. No listhesis. Vertebrae: Acute compression fractures involving the superior endplates of L1 and L2 again seen. Associated mild 20-25% height loss without bony retropulsion. Vertebral body height otherwise maintained. Bone marrow signal intensity diffusely decreased on T1 weighted sequence, nonspecific, but most commonly related to anemia, smoking or obesity. No discrete or worrisome osseous lesions. No other abnormal marrow edema. Conus medullaris and cauda equina: Conus extends to the T12-L1 level. Conus and cauda equina appear normal. Paraspinal and other soft tissues: Mild paraspinous edema adjacent to the acute L1 and L2 compression fractures. Visualized paraspinous soft tissues otherwise unremarkable. Visualized visceral structures within normal limits. Disc levels: No significant disc pathology seen within the lumbar spine. No disc bulge or focal disc herniation. Minimal lower lumbar facet hypertrophy. No stenosis or impingement. IMPRESSION: 1. Acute compression fractures involving the superior endplates of L1 and L2 with up to 20-25% height loss without bony retropulsion. 2. No other acute traumatic injury within the cervical, thoracic, or lumbar spine. 3. Mild degenerative spondylosis at C5-6 and C6-7 without significant spinal stenosis. Associated moderate bilateral C7 foraminal narrowing. Electronically Signed   By: Rise Mu M.D.   On: 03/19/2021 21:42   MR LUMBAR SPINE WO CONTRAST  Result Date: 03/19/2021 CLINICAL DATA:  Initial evaluation for acute trauma, fall. EXAM: MRI CERVICAL, THORACIC AND LUMBAR SPINE WITHOUT CONTRAST  TECHNIQUE: Multiplanar and multiecho pulse sequences of the cervical spine, to include the craniocervical junction and cervicothoracic junction, and thoracic and lumbar spine, were obtained without intravenous contrast. COMPARISON:  Prior CT from earlier the same day. FINDINGS: MRI CERVICAL SPINE FINDINGS Alignment: Physiologic with preservation of the normal cervical lordosis. No listhesis. Vertebrae: Vertebral body height maintained without acute or chronic fracture. Bone marrow signal intensity diffusely decreased on T1 weighted sequence, nonspecific, but most commonly related to anemia, smoking or obesity. No worrisome osseous lesions or abnormal marrow edema. Cord: Normal signal and morphology. No evidence for traumatic cord injury. Ligamentous structures intact. Posterior Fossa, vertebral arteries, paraspinal tissues: Unremarkable. Disc levels: C5-6: Mild disc bulge. No significant spinal stenosis. Foramina remain patent. C6-7: Mild disc bulge with bilateral uncovertebral hypertrophy. No significant spinal stenosis. Moderate bilateral C7 foraminal stenosis. C7-T1: Negative interspace. Mild right-sided facet hypertrophy. No  stenosis. MRI THORACIC SPINE FINDINGS Alignment:  Physiologic.  No listhesis. Vertebrae: Mild chronic compression deformities with height loss seen at the superior endplates of T1 through T5. No associated bony retropulsion. Otherwise, vertebral body height maintained. No acute fracture. Bone marrow signal intensity diffusely decreased on T1 weighted sequence, nonspecific, but most commonly related to anemia, smoking or obesity. No discrete or worrisome osseous lesions. No abnormal marrow edema. Cord: Normal signal and morphology. Ligamentous structures are intact. Paraspinal and other soft tissues: Unremarkable. Disc levels: Unremarkable. No significant disc pathology. No stenosis or impingement. MRI LUMBAR SPINE FINDINGS Segmentation: Standard. Lowest well-formed disc space labeled the  L5-S1 level. Alignment: Physiologic with preservation of the normal lumbar lordosis. No listhesis. Vertebrae: Acute compression fractures involving the superior endplates of L1 and L2 again seen. Associated mild 20-25% height loss without bony retropulsion. Vertebral body height otherwise maintained. Bone marrow signal intensity diffusely decreased on T1 weighted sequence, nonspecific, but most commonly related to anemia, smoking or obesity. No discrete or worrisome osseous lesions. No other abnormal marrow edema. Conus medullaris and cauda equina: Conus extends to the T12-L1 level. Conus and cauda equina appear normal. Paraspinal and other soft tissues: Mild paraspinous edema adjacent to the acute L1 and L2 compression fractures. Visualized paraspinous soft tissues otherwise unremarkable. Visualized visceral structures within normal limits. Disc levels: No significant disc pathology seen within the lumbar spine. No disc bulge or focal disc herniation. Minimal lower lumbar facet hypertrophy. No stenosis or impingement. IMPRESSION: 1. Acute compression fractures involving the superior endplates of L1 and L2 with up to 20-25% height loss without bony retropulsion. 2. No other acute traumatic injury within the cervical, thoracic, or lumbar spine. 3. Mild degenerative spondylosis at C5-6 and C6-7 without significant spinal stenosis. Associated moderate bilateral C7 foraminal narrowing. Electronically Signed   By: Rise Mu M.D.   On: 03/19/2021 21:42   DG Pelvis Portable  Result Date: 03/19/2021 CLINICAL DATA:  Trauma. EXAM: PORTABLE PELVIS 1-2 VIEWS COMPARISON:  None. FINDINGS: Portion of the RIGHT pelvis is off the field of view but okayed by the trauma surgeon. No acute bony abnormalities are noted. A RIGHT femoral catheter is noted. Bowel gas pattern is unremarkable. IMPRESSION: No acute bony abnormalities. Electronically Signed   By: Harmon Pier M.D.   On: 03/19/2021 15:52   CT CHEST ABDOMEN PELVIS  W CONTRAST  Result Date: 03/19/2021 CLINICAL DATA:  Polytrauma, fall EXAM: CT CHEST, ABDOMEN, AND PELVIS WITH CONTRAST CT THORACIC AND LUMBAR SPINE WITH CONTRAST TECHNIQUE: Multidetector CT imaging of the chest, abdomen and pelvis was performed following the standard protocol during bolus administration of intravenous contrast. Multidetector CT imaging of the thoracic and lumbar spine was performed following the standard protocol during bolus administration of intravenous contrast. Dedicated multiplanar reformats were generated and reviewed. RADIATION DOSE REDUCTION: This exam was performed according to the departmental dose-optimization program which includes automated exposure control, adjustment of the mA and/or kV according to patient size and/or use of iterative reconstruction technique. CONTRAST:  OMNIPAQUE IOHEXOL 350 MG/ML SOLN COMPARISON:  None. FINDINGS: CT CHEST FINDINGS Cardiovascular: Incidental calcified aneurysm or chronic focal dissection at the left aspect of the distal aortic arch, maximum caliber of the vessel at this site 4.0 x 3.3 cm (series 4, image 46, series 1, image 28). Normal heart size. No pericardial effusion. Mediastinum/Nodes: No enlarged mediastinal, hilar, or axillary lymph nodes. Thyroid gland, trachea, and esophagus demonstrate no significant findings. Lungs/Pleura: Mild paraseptal emphysema. No pleural effusion or pneumothorax. Musculoskeletal: No chest  wall mass or suspicious osseous lesions identified. CT ABDOMEN PELVIS FINDINGS Hepatobiliary: No solid liver abnormality is seen. No gallstones, gallbladder wall thickening, or biliary dilatation. Pancreas: Unremarkable. No pancreatic ductal dilatation or surrounding inflammatory changes. Spleen: Normal in size without significant abnormality. Adrenals/Urinary Tract: Adrenal glands are unremarkable. Kidneys are normal, without renal calculi, solid lesion, or hydronephrosis. Bladder is unremarkable. Stomach/Bowel: Stomach is  within normal limits. Appendix appears normal. No evidence of bowel wall thickening, distention, or inflammatory changes. Vascular/Lymphatic: Right femoral venous catheter. No enlarged abdominal or pelvic lymph nodes. Reproductive: No mass or other abnormality. Other: Status post umbilical hernia repair.  No ascites. Musculoskeletal: No acute osseous findings. CT THORACIC AND LUMBAR SPINE FINDINGS Alignment: Normal. Vertebral bodies: Acute superior endplate wedge fractures of L1 and L2 with less than 25% anterior height loss (series 5, image 56). Disc spaces: Intact. Soft tissues: Unremarkable.  No evident hematoma. IMPRESSION: 1. Acute superior endplate wedge fractures of L1 and L2 with less than 25% anterior height loss. No other fracture or dislocation identified. 2. No evidence of acute traumatic injury to the organs of the chest, abdomen, or pelvis. 3. 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 Although this does not reflect acute traumatic vascular injury, recommend referral to vascular specialist on a nonemergent, outpatient basis. This recommendation follows 2010 ACCF/AHA/AATS/ACR/ASA/SCA/SCAI/SIR/STS/SVM Guidelines for the Diagnosis and Management of Patients With Thoracic Aortic Disease. Circulation. 2010; 121: Z610-R604. Aortic aneurysm NOS (ICD10-I71.9) 4. Mild emphysema. Acute findings were called by telephone at the time of interpretation on 03/19/2021 at 4:22 pm to Dr. Kris Mouton, Who verbally acknowledged these results. Emphysema (ICD10-J43.9). Electronically Signed   By: Jearld Lesch M.D.   On: 03/19/2021 16:24   CT T-SPINE NO CHARGE  Result Date: 03/19/2021 CLINICAL DATA:  Polytrauma, fall EXAM: CT CHEST, ABDOMEN, AND PELVIS WITH CONTRAST CT THORACIC AND LUMBAR SPINE WITH CONTRAST TECHNIQUE: Multidetector CT imaging of the chest, abdomen and pelvis was  performed following the standard protocol during bolus administration of intravenous contrast. Multidetector CT imaging of the thoracic and lumbar spine was performed following the standard protocol during bolus administration of intravenous contrast. Dedicated multiplanar reformats were generated and reviewed. RADIATION DOSE REDUCTION: This exam was performed according to the departmental dose-optimization program which includes automated exposure control, adjustment of the mA and/or kV according to patient size and/or use of iterative reconstruction technique. CONTRAST:  OMNIPAQUE IOHEXOL 350 MG/ML SOLN COMPARISON:  None. FINDINGS: CT CHEST FINDINGS Cardiovascular: Incidental calcified aneurysm or chronic focal dissection at the left aspect of the distal aortic arch, maximum caliber of the vessel at this site 4.0 x 3.3 cm (series 4, image 46, series 1, image 28). Normal heart size. No pericardial effusion. Mediastinum/Nodes: No enlarged mediastinal, hilar, or axillary lymph nodes. Thyroid gland, trachea, and esophagus demonstrate no significant findings. Lungs/Pleura: Mild paraseptal emphysema. No pleural effusion or pneumothorax. Musculoskeletal: No chest wall mass or suspicious osseous lesions identified. CT ABDOMEN PELVIS FINDINGS Hepatobiliary: No solid liver abnormality is seen. No gallstones, gallbladder wall thickening, or biliary dilatation. Pancreas: Unremarkable. No pancreatic ductal dilatation or surrounding inflammatory changes. Spleen: Normal in size without significant abnormality. Adrenals/Urinary Tract: Adrenal glands are unremarkable. Kidneys are normal, without renal calculi, solid lesion, or hydronephrosis. Bladder is unremarkable. Stomach/Bowel: Stomach is within normal limits. Appendix appears normal. No evidence of bowel wall thickening, distention, or inflammatory changes. Vascular/Lymphatic:  Right femoral venous catheter. No enlarged abdominal or pelvic lymph nodes. Reproductive: No  mass or other abnormality. Other: Status post umbilical hernia repair.  No ascites. Musculoskeletal: No acute osseous findings. CT THORACIC AND LUMBAR SPINE FINDINGS Alignment: Normal. Vertebral bodies: Acute superior endplate wedge fractures of L1 and L2 with less than 25% anterior height loss (series 5, image 56). Disc spaces: Intact. Soft tissues: Unremarkable.  No evident hematoma. IMPRESSION: 1. Acute superior endplate wedge fractures of L1 and L2 with less than 25% anterior height loss. No other fracture or dislocation identified. 2. No evidence of acute traumatic injury to the organs of the chest, abdomen, or pelvis. 3. 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 Although this does not reflect acute traumatic vascular injury, recommend referral to vascular specialist on a nonemergent, outpatient basis. This recommendation follows 2010 ACCF/AHA/AATS/ACR/ASA/SCA/SCAI/SIR/STS/SVM Guidelines for the Diagnosis and Management of Patients With Thoracic Aortic Disease. Circulation. 2010; 121: Z610-R604: [id number]. Aortic aneurysm NOS (ICD10-I71.9) 4. Mild emphysema. Acute findings were called by telephone at the time of interpretation on 03/19/2021 at 4:22 pm to Dr. Kris MoutonAyesha Lovick, Who verbally acknowledged these results. Emphysema (ICD10-J43.9). Electronically Signed   By: Jearld LeschAlex D Bibbey M.D.   On: 03/19/2021 16:24   CT L-SPINE NO CHARGE  Result Date: 03/19/2021 CLINICAL DATA:  Polytrauma, fall EXAM: CT CHEST, ABDOMEN, AND PELVIS WITH CONTRAST CT THORACIC AND LUMBAR SPINE WITH CONTRAST TECHNIQUE: Multidetector CT imaging of the chest, abdomen and pelvis was performed following the standard protocol during bolus administration of intravenous contrast. Multidetector CT imaging of the thoracic and lumbar spine was performed following the standard protocol during bolus administration of intravenous  contrast. Dedicated multiplanar reformats were generated and reviewed. RADIATION DOSE REDUCTION: This exam was performed according to the departmental dose-optimization program which includes automated exposure control, adjustment of the mA and/or kV according to patient size and/or use of iterative reconstruction technique. CONTRAST:  100mL OMNIPAQUE IOHEXOL 350 MG/ML SOLN COMPARISON:  None. FINDINGS: CT CHEST FINDINGS Cardiovascular: Incidental calcified aneurysm or chronic focal dissection at the left aspect of the distal aortic arch, maximum caliber of the vessel at this site 4.0 x 3.3 cm (series 4, image 46, series 1, image 28). Normal heart size. No pericardial effusion. Mediastinum/Nodes: No enlarged mediastinal, hilar, or axillary lymph nodes. Thyroid gland, trachea, and esophagus demonstrate no significant findings. Lungs/Pleura: Mild paraseptal emphysema. No pleural effusion or pneumothorax. Musculoskeletal: No chest wall mass or suspicious osseous lesions identified. CT ABDOMEN PELVIS FINDINGS Hepatobiliary: No solid liver abnormality is seen. No gallstones, gallbladder wall thickening, or biliary dilatation. Pancreas: Unremarkable. No pancreatic ductal dilatation or surrounding inflammatory changes. Spleen: Normal in size without significant abnormality. Adrenals/Urinary Tract: Adrenal glands are unremarkable. Kidneys are normal, without renal calculi, solid lesion, or hydronephrosis. Bladder is unremarkable. Stomach/Bowel: Stomach is within normal limits. Appendix appears normal. No evidence of bowel wall thickening, distention, or inflammatory changes. Vascular/Lymphatic: Right femoral venous catheter. No enlarged abdominal or pelvic lymph nodes. Reproductive: No mass or other abnormality. Other: Status post umbilical hernia repair.  No ascites. Musculoskeletal: No acute osseous findings. CT THORACIC AND LUMBAR SPINE FINDINGS Alignment: Normal. Vertebral bodies: Acute superior endplate wedge fractures  of L1 and L2 with less than 25% anterior height loss (series 5, image 56). Disc spaces: Intact. Soft tissues: Unremarkable.  No evident hematoma. IMPRESSION: 1. Acute superior endplate wedge fractures of L1 and  L2 with less than 25% anterior height loss. No other fracture or dislocation identified. 2. No evidence of acute traumatic injury to the organs of the chest, abdomen, or pelvis. 3. 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 Although this does not reflect acute traumatic vascular injury, recommend referral to vascular specialist on a nonemergent, outpatient basis. This recommendation follows 2010 ACCF/AHA/AATS/ACR/ASA/SCA/SCAI/SIR/STS/SVM Guidelines for the Diagnosis and Management of Patients With Thoracic Aortic Disease. Circulation. 2010; 121: Z610-R604: E266-e369. Aortic aneurysm NOS (ICD10-I71.9) 4. Mild emphysema. Acute findings were called by telephone at the time of interpretation on 03/19/2021 at 4:22 pm to Dr. Kris MoutonAyesha Lovick, Who verbally acknowledged these results. Emphysema (ICD10-J43.9). Electronically Signed   By: Jearld LeschAlex D Bibbey M.D.   On: 03/19/2021 16:24   DG Chest Portable 1 View  Result Date: 03/19/2021 CLINICAL DATA:  Trauma. EXAM: PORTABLE CHEST 1 VIEW COMPARISON:  None. FINDINGS: Of note, the costophrenic angles are not included on the field of view. The heart size and mediastinal contours are within normal limits. Aortic calcifications. Both lungs are clear. No large volume pleural effusion or pneumothorax. Old fractures of the right fourth through seventh ribs. IMPRESSION: No acute cardiopulmonary or osseous abnormality. Electronically Signed   By: Sherron AlesLaura  Parra M.D.   On: 03/19/2021 15:53   DG Knee Right Port  Result Date: 03/19/2021 CLINICAL DATA:  46 year old male with possible metal in RIGHT knee. Screen for MRI. EXAM: PORTABLE RIGHT KNEE - 1-2 VIEW  COMPARISON:  None. FINDINGS: There is no evidence of metallic foreign body in the RIGHT knee. The bony structures and joint spaces are unremarkable. No fracture, subluxation, dislocation or focal bony lesions identified. There is no evidence of joint effusion. IMPRESSION: Negative.  No evidence of metallic foreign body. Electronically Signed   By: Harmon PierJeffrey  Hu M.D.   On: 03/19/2021 19:18    Procedures None  Hospital Course:  Nelta Numbersric Berber is a 46yo male who [facility] as a level 1 trauma after jump from 20 foot balcony while incarcerated as a suicide attempt after learning his father was admitted to ICU. Reports landing on his back. Workup showed L1-2 superior end plate fracture and paraplegia with poor rectal tone. Patient was admitted to the trauma service with neurosurgery consult. MRI spine showed no evidence of cord lesion or ligamentous injury. Neurosurgery advised conservative management with TLSO when ambulating or out of bed, otherwise can wear for comfort. Neurological exam improved with some return of sensation and movement of bilateral lower extremities. Psychiatry was consulted due to suicide attempt and recommended and cleared the patient for discharge from a psychiatric standpoint with outpatient follow up. Of note, they do feel he is likely to self-harm again to try to get [facility] admitted. Patient worked with therapies during this admission. On 2/20 the patient was felt stable for discharge.  Patient will follow up as below and knows to call with questions or concerns.    I have personally reviewed the patients medication history on the [place] controlled substance database.     Allergies as of 03/21/2021       Reactions   Amoxicillin Hives   Ciprofloxacin Hives   Penicillins Hives   Patient reported  Did it involve swelling of the face/tongue/throat, SOB, or low BP? No Did it involve sudden or severe rash/hives, skin peeling,  or any reaction on the inside of your mouth or nose? Yes Did you need to seek medical attention at a hospital or doctor's office? Yes When did it last happen?     1 Year Ago  If all above answers are "NO", may proceed with cephalosporin use.        Medication List     STOP taking these medications    clonazePAM 2 MG tablet Commonly known as: KLONOPIN   methadone 10 MG/5ML solution Commonly known as: DOLOPHINE       TAKE these medications    acetaminophen 500 MG tablet Commonly known as: TYLENOL Take 2 tablets (1,000 mg total) by mouth every 6 (six) hours as needed for mild pain.   docusate sodium 100 MG capsule Commonly known as: COLACE Take 1 capsule (100 mg total) by mouth 2 (two) times daily.   folic acid 1 MG tablet Commonly known as: FOLVITE Take 1 tablet (1 mg total) by mouth daily. Start taking on: March 22, 2021   ibuprofen 100 MG tablet Commonly known as: ADVIL Take 6 tablets (600 mg total) by mouth every 6 (six) hours as needed for moderate pain.   Methocarbamol 1000 MG Tabs Take 1,000 mg by mouth every 8 (eight) hours as needed for muscle spasms.   multivitamin with minerals Tabs tablet Take 1 tablet by mouth daily. Start taking on: March 22, 2021   nicotine 7 mg/24hr patch Commonly known as: NICODERM CQ - dosed in mg/24 hr Place 1 patch (7 mg total) onto the skin daily.   Oxycodone HCl 10 MG Tabs Take 0.5-1 tablets (5-10 mg total) by mouth every 6 (six) hours as needed for moderate pain or severe pain (5mg  for moderate pain, 10mg  for severe pain).   polyethylene glycol 17 g packet Commonly known as: MiraLax Take 17 g by mouth daily as needed for mild constipation.   thiamine 100 MG tablet Take 1 tablet (100 mg total) by mouth daily. Start taking on: March 22, 2021          Follow-up Information     Ostergard, Clovis Pu, MD. Schedule an appointment as soon as possible for a visit.   Specialty: Neurosurgery Why: regarding back  injury Contact information: [address] [place] [address] [phone]         [facility] Follow up.   Why: As needed Contact information: [address] [place] [address] [phone]        Vesta Mixer. Call.   Why: Please see list of resources for other options Contact information: [address]  [address] [place] [address] [phone]                 Moderate Medical Decision Making  Signed: Franne Forts, [facility] 03/21/2021, 9:20 AM Please see Amion for pager number during day hours 7:00am-4:30pm

## 2021-03-21 NOTE — Progress Notes (Signed)
° °  Trauma/Critical Care Follow Up Note  Subjective:    Overnight Issues:   Objective:  Vital signs for last 24 hours: Temp:  [97.5 F (36.4 C)-98.8 F (37.1 C)] 98.6 F (37 C) (02/20 0746) Pulse Rate:  [67-99] 77 (02/20 0746) Resp:  [14-25] 21 (02/20 0746) BP: (108-139)/(63-88) 126/87 (02/20 0746) SpO2:  [96 %-100 %] 98 % (02/20 0746)  Hemodynamic parameters for last 24 hours:    Intake/Output from previous day: 02/19 0701 - 02/20 0700 In: 457 [P.O.:357; I.V.:100] Out: 625 [Urine:625]  Intake/Output this shift: No intake/output data recorded.  Vent settings for last 24 hours:    Physical Exam:  Gen: comfortable, no distress Neuro: non-focal exam HEENT: PERRL Neck: supple CV: RRR Pulm: unlabored breathing Abd: soft, NT GU: clear yellow urine Extr: wwp, trace edema, 5/5 BUE/RLE, moves toes on LLE but "unable to move leg"   Results for orders placed or performed during the hospital encounter of 03/19/21 (from the past 24 hour(s))  CBC     Status: Abnormal   Collection Time: 03/21/21  5:06 AM  Result Value Ref Range   WBC 11.3 (H) 4.0 - 10.5 K/uL   RBC 4.67 4.22 - 5.81 MIL/uL   Hemoglobin 12.3 (L) 13.0 - 17.0 g/dL   HCT 02.4 (L) 09.7 - 35.3 %   MCV 79.9 (L) 80.0 - 100.0 fL   MCH 26.3 26.0 - 34.0 pg   MCHC 33.0 30.0 - 36.0 g/dL   RDW 29.9 (H) 24.2 - 68.3 %   Platelets 146 (L) 150 - 400 K/uL   nRBC 0.0 0.0 - 0.2 %  Basic metabolic panel     Status: Abnormal   Collection Time: 03/21/21  5:06 AM  Result Value Ref Range   Sodium 144 135 - 145 mmol/L   Potassium 5.4 (H) 3.5 - 5.1 mmol/L   Chloride 113 (H) 98 - 111 mmol/L   CO2 19 (L) 22 - 32 mmol/L   Glucose, Bld 85 70 - 99 mg/dL   BUN 18 6 - 20 mg/dL   Creatinine, Ser 4.19 (H) 0.61 - 1.24 mg/dL   Calcium 8.4 (L) 8.9 - 10.3 mg/dL   GFR, Estimated 35 (L) >60 mL/min   Anion gap 12 5 - 15    Assessment & Plan:  Present on Admission: **None**    LOS: 2 days   Additional comments:I reviewed the  patient's new clinical lab test results.    FFH  L1-2 superior endplate fractures - NSGY c/s, Dr. Maurice Small, MRI negative for cord injury.  TLSO brace when OOB, therapies ordered.   Paraplegia, poor rectal tone - resolved, 5/5 BUE/RLE, moves toes on LLE but "unable to move leg". Question conversion d/o vs malingering, suspect malingering.  Suicide attempt - psych consult pending FEN - regular diet, IVFs DVT - SCDs, Lovenox Dispo - anticipate d/c today  Diamantina Monks, MD Trauma & General Surgery Please use [email or web address] to contact on call provider  03/21/2021  *Care during the described time interval was provided by me. I have reviewed this patient's available data, including medical history, events of note, physical examination and test results as part of my evaluation.

## 2021-03-21 NOTE — Progress Notes (Signed)
Physical Therapy Treatment Patient Details Name: Kerry Scott MRN: [id number] DOB: 31-Jul-1975 Today's Date: 03/21/2021   History of Present Illness 46 y.o. male presents to [facility] hospital on 03/19/2021 after jumping from a 20' balcony. Pt found to have L1-2 superior endplate fxs. Pt reporting quadriplegia since fall although no cord compression noted on MRI from 2/18. No PMH of note.    PT Comments    The pt was seen for continued attempts to mobilize and strengthen LLE. However, due to timing of d/c, session limited to assisting the pt to dress, don TLSO, and complete sit-stand transfers. The pt did not have buckling of LLE in stance, but did not advance or step well with LLE and was very dependent on BUE support and RLE to complete pivot to transport chair for d/c. The pt was able to use RW on PT eval and take small steps, but was unable to hold RW at this time due to handcuffs and therefore required increased assist. Continue to recommend any available follow up therapies after d/c to improve functional strength and stability for transfers.    Recommendations for follow up therapy are one component of a multi-disciplinary discharge planning process, led by the attending physician.  Recommendations may be updated based on patient status, additional functional criteria and insurance authorization.  Follow Up Recommendations  Other (comment) (PT services at correctional facility)     Assistance Recommended at Discharge Frequent or constant Supervision/Assistance  Patient can return home with the following A lot of help with walking and/or transfers;A little help with bathing/dressing/bathroom;Help with stairs or ramp for entrance;Assist for transportation;Assistance with cooking/housework   Equipment Recommendations  Rolling walker (2 wheels);Wheelchair (measurements PT);Wheelchair cushion (measurements PT)    Recommendations for Other Services       Precautions / Restrictions  Precautions Precautions: Fall;Back Precaution Booklet Issued: Yes (comment) Precaution Comments: pt unable to recall precautions from prior session Required Braces or Orthoses: Spinal Brace Spinal Brace: Thoracolumbosacral orthotic;Applied in sitting position (increased time and minA to don) Restrictions Weight Bearing Restrictions: No     Mobility  Bed Mobility Overal bed mobility: Needs Assistance Bed Mobility: Rolling, Sidelying to Sit Rolling: Supervision Sidelying to sit: Supervision       General bed mobility comments: VC for log roll    Transfers Overall transfer level: Needs assistance Equipment used: 2 person hand held assist Transfers: Sit to/from Stand, Bed to chair/wheelchair/BSC Sit to Stand: Mod assist, +2 safety/equipment   Step pivot transfers: Mod assist, +2 physical assistance       General transfer comment: modA to rise with heady assist needed from R side to maintain upright. pt with hands in handcuffs, so unable to reach RW (also will not have access at jail) the pt was then able to take small pivotal steps to transport chair with modA of 2, minimal adjustments to LLE but minimal buckling with movement of RLE    Ambulation/Gait Ambulation/Gait assistance: Mod assist Gait Distance (Feet): 2 Feet Assistive device: 2 person hand held assist Gait Pattern/deviations: Step-to pattern, Decreased stance time - left, Decreased step length - left, Decreased weight shift to left Gait velocity: reduced     General Gait Details: pt with L knee blocked when standing, able to take small pivotal steps with RLE to reach transport chair. BUE from therapists as pt unable to reach RW with handcuffs in place      Balance Overall balance assessment: Needs assistance Sitting-balance support: No upper extremity supported, Feet supported Sitting balance-Leahy Scale:  Fair Sitting balance - Comments: pt states he is unable to lean forwards or back, but was able to lean  outside BOS well when donning pants, minA to complete donning of back brace   Standing balance support: Bilateral upper extremity supported, Reliant on assistive device for balance Standing balance-Leahy Scale: Poor Standing balance comment: modA with BUE support                            Cognition Arousal/Alertness: Awake/alert Behavior During Therapy: WFL for tasks assessed/performed Overall Cognitive Status: Within Functional Limits for tasks assessed                                          Exercises      General Comments General comments (skin integrity, edema, etc.): VSS on RA, sherriff present      Pertinent Vitals/Pain Pain Assessment Pain Assessment: 0-10 Pain Score: 8  Pain Location: back Pain Descriptors / Indicators: Aching Pain Intervention(s): Limited activity within patient's tolerance, Monitored during session, Patient requesting pain meds-RN notified    Home Living Family/patient expects to be discharged to:: Other (Comment)                   Additional Comments: correctional facility    Prior Function            PT Goals (current goals can now be found in the care plan section) Acute Rehab PT Goals Patient Stated Goal: to regain strength PT Goal Formulation: With patient Time For Goal Achievement: 04/03/21 Potential to Achieve Goals: Good Progress towards PT goals: Progressing toward goals    Frequency    Min 5X/week      PT Plan Current plan remains appropriate    Co-evaluation PT/OT/SLP Co-Evaluation/Treatment: Yes Reason for Co-Treatment: Other (comment) (limited time prior to d/c) PT goals addressed during session: Mobility/safety with mobility;Balance        AM-PAC PT "6 Clicks" Mobility   Outcome Measure  Help needed turning from your back to your side while in a flat bed without using bedrails?: A Little Help needed moving from lying on your back to sitting on the side of a flat bed  without using bedrails?: A Little Help needed moving to and from a bed to a chair (including a wheelchair)?: A Lot Help needed standing up from a chair using your arms (e.g., wheelchair or bedside chair)?: A Lot Help needed to walk in hospital room?: Total Help needed climbing 3-5 steps with a railing? : Total 6 Click Score: 12    End of Session Equipment Utilized During Treatment: Back brace Activity Tolerance: Patient tolerated treatment well Patient left: in chair;with nursing/sitter in room (in transport chair) Nurse Communication: Mobility status PT Visit Diagnosis: Other abnormalities of gait and mobility (R26.89);Muscle weakness (generalized) (M62.81);Other symptoms and signs involving the nervous system (R29.898);Pain     Time: 8366-2947 PT Time Calculation (min) (ACUTE ONLY): 19 min  Charges:  $Therapeutic Activity: 8-22 mins                     Vickki Muff, PT, DPT   Acute Rehabilitation Department Pager #: [phone]   Ronnie Derby 03/21/2021, 1:44 PM
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
## 2021-03-21 NOTE — Evaluation (Signed)
Occupational Therapy Evaluation Patient Details Name: Kerry Scott MRN: [id number] DOB: [birth year] Today's Date: 03/21/2021   History of Present Illness 46 y.o. male presents to [facility] hospital on 03/19/2021 after jumping from a 20' balcony in jail L1-2 superior endplate fxs. Pt reporting quadriplegia since fall although no cord compression noted on MRI from 2/18. PMH: IVDU   Clinical Impression   Pt from prison and plan is to DC back to prison. Limited eval due to pt being discharged. Inconsistently moving LU/LE. Attempted education on compensatory strategies for ADL and functional mobility. Pt required +2 mod A to pivot step to chair with +2 HHA as pt's hands were cuffed together andhe was unable to use a RW. Pt will most likely need a RW for mobility. Recommend HHOT if an option.     Recommendations for follow up therapy are one component of a multi-disciplinary discharge planning process, led by the attending physician.  Recommendations may be updated based on patient status, additional functional criteria and insurance authorization.   Follow Up Recommendations  Home health OT (at facility)    Assistance Recommended at Discharge Frequent or constant Supervision/Assistance  Patient can return home with the following A lot of help with walking and/or transfers;A lot of help with bathing/dressing/bathroom;Assistance with cooking/housework;Assist for transportation;Help with stairs or ramp for entrance    Functional Status Assessment  Patient has had a recent decline in their functional status and demonstrates the ability to make significant improvements in function in a reasonable and predictable amount of time.  Equipment Recommendations  BSC/3in1;Other (comment) (RW)    Recommendations for Other Services       Precautions / Restrictions Precautions Precautions: Fall;Back Precaution Booklet Issued: Yes (comment) Precaution Comments: pt unable to recall precautions from prior  session Required Braces or Orthoses: Spinal Brace Spinal Brace: Thoracolumbosacral orthotic;Applied in sitting position (increased time and minA to don) Restrictions Weight Bearing Restrictions: No      Mobility Bed Mobility Overal bed mobility: Needs Assistance Bed Mobility: Rolling, Sidelying to Sit Rolling: Supervision Sidelying to sit: Supervision       General bed mobility comments: VC for log roll    Transfers Overall transfer level: Needs assistance Equipment used: 2 person hand held assist Transfers: Sit to/from Stand, Bed to chair/wheelchair/BSC Sit to Stand: Mod assist, +2 safety/equipment     Step pivot transfers: Mod assist, +2 physical assistance     General transfer comment: modA to rise with heady assist needed from R side to maintain upright. pt with hands in handcuffs, so unable to reach RW (also will not have access at jail) the pt was then able to take small pivotal steps to transport chair with modA of 2, minimal adjustments to LLE but minimal buckling with movement of RLE      Balance Overall balance assessment: Needs assistance Sitting-balance support: No upper extremity supported, Feet supported Sitting balance-Leahy Scale: Fair Sitting balance - Comments: pt states he is unable to lean forwards or back, but was able to lean outside BOS well when donning pants, minA to complete donning of back brace   Standing balance support: Bilateral upper extremity supported, Reliant on assistive device for balance Standing balance-Leahy Scale: Poor Standing balance comment: modA with BUE support                           ADL either performed or assessed with clinical judgement   ADL Overall ADL's : Needs assistance/impaired  Functional mobility during ADLs: Moderate assistance;+2 for physical assistance General ADL Comments: Attempted education on back precautions adn ADL tasks and  donning/doffing brace     Vision         Perception     Praxis      Pertinent Vitals/Pain Pain Assessment Pain Assessment: Faces Faces Pain Scale: Hurts even more Pain Location: back Pain Descriptors / Indicators: Aching Pain Intervention(s): Limited activity within patient's tolerance     Hand Dominance Right   Extremity/Trunk Assessment Upper Extremity Assessment LUE Deficits / Details: primarily using RUE; minimal movemetn LUE on command, however able to use LUE to reach behind his back, grab his brace adn fasten it.  RUE WFL   Lower Extremity Assessment Lower Extremity Assessment: Defer to PT evaluation   Cervical / Trunk Assessment Cervical / Trunk Assessment: Other exceptions (back fx; unablet o achieve full upright posture in stanidng)   Communication     Cognition Arousal/Alertness: Awake/alert Behavior During Therapy: WFL for tasks assessed/performed Overall Cognitive Status: Within Functional Limits for tasks assessed                                       General Comments  VSS on RA    Exercises     Shoulder Instructions      Home Living Family/patient expects to be discharged to:: Other (Comment)                                 Additional Comments: correctional facility      Prior Functioning/Environment Prior Level of Function : Independent/Modified Independent                        OT Problem List: Decreased strength;Decreased range of motion;Decreased activity tolerance;Impaired balance (sitting and/or standing);Decreased coordination;Decreased safety awareness;Decreased knowledge of use of DME or AE;Pain;Decreased knowledge of precautions      OT Treatment/Interventions: Self-care/ADL training    OT Goals(Current goals can be found in the care plan section) Acute Rehab OT Goals Patient Stated Goal: none stated OT Goal Formulation: All assessment and education complete, DC therapy (pt being  discharged)  OT Frequency: Other (comment) (pt being discharged)    Co-evaluation PT/OT/SLP Co-Evaluation/Treatment: Yes Reason for Co-Treatment: Other (comment) (pt discharging) PT goals addressed during session: Mobility/safety with mobility;Balance OT goals addressed during session: ADL's and self-care      AM-PAC OT "6 Clicks" Daily Activity     Outcome Measure Help from another person eating meals?: None Help from another person taking care of personal grooming?: A Little Help from another person toileting, which includes using toliet, bedpan, or urinal?: A Lot Help from another person bathing (including washing, rinsing, drying)?: A Lot Help from another person to put on and taking off regular upper body clothing?: A Lot Help from another person to put on and taking off regular lower body clothing?: A Lot 6 Click Score: 15   End of Session Equipment Utilized During Treatment: Other (comment) (unable to use RW as pt wearing handcuffs) Nurse Communication: Mobility status  Activity Tolerance: Patient tolerated treatment well Patient left: Other (comment) (wc for DC)  OT Visit Diagnosis: Unsteadiness on feet (R26.81);Other abnormalities of gait and mobility (R26.89);Muscle weakness (generalized) (M62.81);Pain Pain - part of body:  (back)  Time: 7782-4235 OT Time Calculation (min): 11 min Charges:  OT General Charges $OT Visit: 1 Visit OT Evaluation $OT Eval Low Complexity: 1 Low  Luisa Dago, OT/L   Acute OT Clinical Specialist Acute Rehabilitation Services Pager [phone] Office [phone]   [facility] 03/21/2021, 3:11 PM

## 2021-03-21 NOTE — TOC Transition Note (Signed)
Transition of Care [facility]) - CM/SW Discharge Note   Patient Details  Name: Tice Mcmeans MRN: [id number] Date of Birth: May 15, 1975  Transition of Care [facility]) CM/SW Contact:  Ella Bodo, RN Phone Number: 03/21/2021, 12:38 PM   Clinical Narrative:     46 y.o. male presents to [facility] hospital on 03/19/2021 after jumping from a 63' balcony. Pt found to have L1-2 superior endplate fxs. Pt reporting quadriplegia since fall although no cord compression noted on MRI from 2/18. No PMH of note. PTA, pt incarcerated at [facility]; officer at bedside.  Psych MD has cleared for return; recommends OP follow up for counseling/SA treatment.  List of OP resources provided and sent with patient.   Final next level of care: Corrections Facility Barriers to Discharge: Barriers Resolved                       Discharge Plan and Services   Discharge Planning Services: CM Consult                                 Social Determinants of Health (SDOH) Interventions     Readmission Risk Interventions No flowsheet data found.  Reinaldo Raddle, RN, BSN  Trauma/Neuro ICU Case Manager [phone]
